# Patient Record
Sex: Female | Born: 1985 | Race: Black or African American | Hispanic: No | Marital: Single | Smoking: Current every day smoker
Health system: Southern US, Community
[De-identification: ages and names within clinical notes are randomized; demographics above are authoritative.]

## PROBLEM LIST (undated history)

## (undated) HISTORY — PX: ABSCESS DRAINAGE: SHX1119

---

## 2009-07-15 ENCOUNTER — Emergency Department (HOSPITAL_COMMUNITY): Admission: EM | Admit: 2009-07-15 | Discharge: 2009-07-15 | Payer: Self-pay | Admitting: Emergency Medicine

## 2009-12-08 ENCOUNTER — Inpatient Hospital Stay (HOSPITAL_COMMUNITY): Admission: AD | Admit: 2009-12-08 | Discharge: 2009-12-08 | Payer: Self-pay | Admitting: Obstetrics & Gynecology

## 2011-02-01 ENCOUNTER — Encounter: Payer: Self-pay | Admitting: *Deleted

## 2011-02-01 ENCOUNTER — Emergency Department (HOSPITAL_BASED_OUTPATIENT_CLINIC_OR_DEPARTMENT_OTHER)
Admission: EM | Admit: 2011-02-01 | Discharge: 2011-02-02 | Disposition: A | Discharge status: Home / Self Care | Payer: Self-pay | Attending: Emergency Medicine | Admitting: Emergency Medicine

## 2011-02-01 ENCOUNTER — Emergency Department (INDEPENDENT_AMBULATORY_CARE_PROVIDER_SITE_OTHER): Payer: Self-pay

## 2011-02-01 DIAGNOSIS — R112 Nausea with vomiting, unspecified: Secondary | ICD-10-CM | POA: Insufficient documentation

## 2011-02-01 DIAGNOSIS — A599 Trichomoniasis, unspecified: Secondary | ICD-10-CM | POA: Insufficient documentation

## 2011-02-01 DIAGNOSIS — R109 Unspecified abdominal pain: Secondary | ICD-10-CM

## 2011-02-01 DIAGNOSIS — N739 Female pelvic inflammatory disease, unspecified: Secondary | ICD-10-CM | POA: Insufficient documentation

## 2011-02-01 DIAGNOSIS — Z79899 Other long term (current) drug therapy: Secondary | ICD-10-CM | POA: Insufficient documentation

## 2011-02-01 DIAGNOSIS — N730 Acute parametritis and pelvic cellulitis: Secondary | ICD-10-CM

## 2011-02-01 DIAGNOSIS — D72829 Elevated white blood cell count, unspecified: Secondary | ICD-10-CM

## 2011-02-01 LAB — URINALYSIS, ROUTINE W REFLEX MICROSCOPIC
Glucose, UA: NEGATIVE mg/dL
pH: 6 (ref 5.0–8.0)

## 2011-02-01 LAB — WET PREP, GENITAL: Yeast Wet Prep HPF POC: NONE SEEN

## 2011-02-01 LAB — CBC
HCT: 31.4 % — ABNORMAL LOW (ref 36.0–46.0)
MCHC: 31.2 g/dL (ref 30.0–36.0)
Platelets: 223 10*3/uL (ref 150–400)
RDW: 21.3 % — ABNORMAL HIGH (ref 11.5–15.5)
WBC: 19.3 10*3/uL — ABNORMAL HIGH (ref 4.0–10.5)

## 2011-02-01 LAB — DIFFERENTIAL
Basophils Absolute: 0 10*3/uL (ref 0.0–0.1)
Basophils Relative: 0 % (ref 0–1)
Lymphocytes Relative: 8 % — ABNORMAL LOW (ref 12–46)
Monocytes Absolute: 0.8 10*3/uL (ref 0.1–1.0)
Neutro Abs: 17 10*3/uL — ABNORMAL HIGH (ref 1.7–7.7)
Neutrophils Relative %: 88 % — ABNORMAL HIGH (ref 43–77)

## 2011-02-01 LAB — URINE MICROSCOPIC-ADD ON

## 2011-02-01 LAB — PREGNANCY, URINE: Preg Test, Ur: NEGATIVE

## 2011-02-01 MED ORDER — ONDANSETRON HCL 4 MG/2ML IJ SOLN
4.0000 mg | Freq: Once | INTRAMUSCULAR | Status: AC
  Administered 2011-02-01: 4 mg via INTRAVENOUS
  Filled 2011-02-01: qty 2

## 2011-02-01 MED ORDER — HYDROMORPHONE HCL 1 MG/ML IJ SOLN
1.0000 mg | Freq: Once | INTRAMUSCULAR | Status: AC
  Administered 2011-02-01: 1 mg via INTRAVENOUS
  Filled 2011-02-01: qty 1

## 2011-02-01 MED ORDER — SODIUM CHLORIDE 0.9 % IV BOLUS (SEPSIS)
1000.0000 mL | Freq: Once | INTRAVENOUS | Status: AC
  Administered 2011-02-01: 1000 mL via INTRAVENOUS

## 2011-02-01 NOTE — ED Notes (Signed)
Pt brought in by EMS for lower abd  " cramping"  X 1 day

## 2011-02-02 LAB — COMPREHENSIVE METABOLIC PANEL
ALT: 8 U/L (ref 0–35)
AST: 22 U/L (ref 0–37)
Albumin: 4.1 g/dL (ref 3.5–5.2)
Chloride: 102 mEq/L (ref 96–112)
Creatinine, Ser: 0.9 mg/dL (ref 0.50–1.10)
Sodium: 138 mEq/L (ref 135–145)
Total Bilirubin: 0.4 mg/dL (ref 0.3–1.2)

## 2011-02-02 MED ORDER — METRONIDAZOLE 500 MG PO TABS: 500.0000 mg | ORAL_TABLET | Freq: Two times a day (BID) | ORAL | Status: AC

## 2011-02-02 MED ORDER — HYDROMORPHONE HCL 1 MG/ML IJ SOLN
1.0000 mg | Freq: Once | INTRAMUSCULAR | Status: AC
  Administered 2011-02-02: 1 mg via INTRAVENOUS
  Filled 2011-02-02: qty 1

## 2011-02-02 MED ORDER — SODIUM CHLORIDE 0.9 % IV BOLUS (SEPSIS)
1000.0000 mL | Freq: Once | INTRAVENOUS | Status: AC
  Administered 2011-02-02: 1000 mL via INTRAVENOUS

## 2011-02-02 MED ORDER — DEXTROSE 5 % IV SOLN
1.0000 g | Freq: Once | INTRAVENOUS | Status: AC
  Administered 2011-02-02: 1 g via INTRAVENOUS
  Filled 2011-02-02: qty 1

## 2011-02-02 MED ORDER — DOXYCYCLINE HYCLATE 100 MG PO CAPS: 100.0000 mg | ORAL_CAPSULE | Freq: Two times a day (BID) | ORAL | Status: AC

## 2011-02-02 MED ORDER — BELLADONNA ALK-PHENOBARBITAL 16.2 MG PO TABS: 1.0000 | ORAL_TABLET | ORAL | Status: AC | PRN

## 2011-02-02 MED ORDER — PROMETHAZINE HCL 25 MG PO TABS: 25.0000 mg | ORAL_TABLET | Freq: Four times a day (QID) | ORAL | Status: AC | PRN

## 2011-02-02 MED ORDER — IOHEXOL 300 MG/ML  SOLN
100.0000 mL | Freq: Once | INTRAMUSCULAR | Status: AC | PRN
  Administered 2011-02-02: 100 mL via INTRAVENOUS

## 2011-02-02 NOTE — ED Provider Notes (Signed)
History     CSN: [id number] Arrival date & time: 02/01/2011 10:39 PM  Chief Complaint  Patient presents with  . Abdominal Cramping    HPI  (Consider location/radiation/quality/duration/timing/severity/associated sxs/prior treatment)  Patient is a 25 y.o. female presenting with cramps. The history is provided by the patient.  Abdominal Cramping The primary symptoms of the illness include abdominal pain. The primary symptoms of the illness do not include fever, shortness of breath, diarrhea or dysuria.  Symptoms associated with the illness do not include chills, constipation or back pain. Significant associated medical issues do not include diabetes or gallstones.   patient woke up this morning with left-sided lower abdominal cramping described as sharp and severe in nature. Her symptoms have been waxing and waning throughout the day. She has associated nausea, no vomiting or diarrhea. She started her menstrual cycle today, which was normal and on time. She is sexually active and denies any new discharge or lesions. No rash. No trauma. No back pain. No dysuria, urgency or frequency. She denies any history of STD. She does have a history of anemia due to heavy cycles.  History reviewed. No pertinent past medical history.  History reviewed. No pertinent past surgical history.  History reviewed. No pertinent family history.  History  Substance Use Topics  . Smoking status: Never Smoker   . Smokeless tobacco: Not on file  . Alcohol Use: No    OB History    Grav Para Term Preterm Abortions TAB SAB Ect Mult Living                  Review of Systems  Review of Systems  Constitutional: Negative for fever and chills.  HENT: Negative for neck pain and neck stiffness.   Eyes: Negative for pain.  Respiratory: Negative for shortness of breath.   Cardiovascular: Negative for chest pain.  Gastrointestinal: Positive for abdominal pain. Negative for diarrhea and constipation.    Genitourinary: Negative for dysuria, flank pain and genital sores.  Musculoskeletal: Negative for back pain.  Skin: Negative for rash.  Neurological: Negative for headaches.  All other systems reviewed and are negative.    Allergies  Review of patient's allergies indicates no known allergies.  Home Medications   Current Outpatient Rx  Name Route Sig Dispense Refill  . FERROUS SULFATE 325 (65 FE) MG PO TBEC Oral Take 325 mg by mouth daily.      Marland Kitchen VITAMIN B-12 1000 MCG PO TABS Oral Take 1,000 mcg by mouth daily.        Physical Exam    BP 103/66  Pulse 101  Temp(Src) 98.6 F (37 C) (Oral)  Resp 18  SpO2 97%  LMP 01/30/2011  Physical Exam  Constitutional: She is oriented to person, place, and time. She appears well-developed and well-nourished.  HENT:  Head: Normocephalic and atraumatic.  Eyes: Conjunctivae and EOM are normal. Pupils are equal, round, and reactive to light.  Neck: Trachea normal. Neck supple. No thyromegaly present.  Cardiovascular: Normal rate, regular rhythm, S1 normal, S2 normal and normal pulses.     No systolic murmur is present   No diastolic murmur is present  Pulses:      Radial pulses are 2+ on the right side, and 2+ on the left side.  Pulmonary/Chest: Effort normal and breath sounds normal. She has no wheezes. She has no rhonchi. She has no rales. She exhibits no tenderness.  Abdominal: Soft. Normal appearance and bowel sounds are normal. There is no CVA tenderness and  negative Murphy's sign.       Mild diffuse abdominal tenderness, localizes discomfort left lower abdomen. Voluntary guarding. No rebound. No rash or discoloration.   Neurological: She is alert and oriented to person, place, and time. She has normal strength. No cranial nerve deficit or sensory deficit. GCS eye subscore is 4. GCS verbal subscore is 5. GCS motor subscore is 6.  Skin: Skin is warm and dry. No rash noted. She is not diaphoretic.  Psychiatric: Her speech is normal.        Cooperative and appropriate    ED Course  Pelvic exam Date/Time: 02/02/2011 2:33 AM Performed by: Sunnie Nielsen Authorized by: Sunnie Nielsen Consent: Verbal consent obtained. Risks and benefits: risks, benefits and alternatives were discussed Consent given by: patient Patient understanding: patient states understanding of the procedure being performed Patient consent: the patient's understanding of the procedure matches consent given Procedure consent: procedure consent matches procedure scheduled Patient identity confirmed: verbally with patient Time out: Immediately prior to procedure a "time out" was called to verify the correct patient, procedure, equipment, support staff and site/side marked as required. Preparation: Patient was prepped and draped in the usual sterile fashion. Comments: Normal external genitalia without rashes or lesions. Dark blood in vaginal vault consistent with history of being on her menstrual cycle. Normal cervix, no masses or localized adnexal tenderness, diffuse mild tenderness throughout   (including critical care time)  Labs Reviewed  URINALYSIS, ROUTINE W REFLEX MICROSCOPIC - Abnormal; Notable for the following:    Color, Urine RED (*) BIOCHEMICALS MAY BE AFFECTED BY COLOR   Appearance CLOUDY (*)    Hgb urine dipstick LARGE (*)    Ketones, ur 15 (*)    Leukocytes, UA SMALL (*)    All other components within normal limits  URINE MICROSCOPIC-ADD ON - Abnormal; Notable for the following:    Squamous Epithelial / LPF FEW (*)    All other components within normal limits  CBC - Abnormal; Notable for the following:    WBC 19.3 (*)    Hemoglobin 9.8 (*)    HCT 31.4 (*)    MCV 72.9 (*)    MCH 22.7 (*)    RDW 21.3 (*)    All other components within normal limits  DIFFERENTIAL - Abnormal; Notable for the following:    Neutrophils Relative 88 (*)    Neutro Abs 17.0 (*)    Lymphocytes Relative 8 (*)    All other components within normal limits   COMPREHENSIVE METABOLIC PANEL - Abnormal; Notable for the following:    Glucose, Bld 113 (*)    All other components within normal limits  WET PREP, GENITAL - Abnormal; Notable for the following:    Trich, Wet Prep FEW (*)    WBC, Wet Prep HPF POC FEW (*)    All other components within normal limits  PREGNANCY, URINE  LIPASE, BLOOD  GC/CHLAMYDIA PROBE AMP, GENITAL  RPR   Ct Abdomen Pelvis W Contrast  02/02/2011  *RADIOLOGY REPORT*  Clinical Data: Diffuse abdominal pain, nausea and vomiting. Leukocytosis.  CT ABDOMEN AND PELVIS WITH CONTRAST  Technique:  Multidetector CT imaging of the abdomen and pelvis was performed following the standard protocol during bolus administration of intravenous contrast.  Contrast: OMNIPAQUE IOHEXOL 300 MG/ML IV SOLN  Comparison: None.  Findings: The visualized lung bases are clear.  The liver and spleen are unremarkable in appearance.  The gallbladder is within normal limits.  Mildly increased attenuation within the gallbladder likely reflects beam  hardening artifact. The pancreas and adrenal glands are unremarkable.  The kidneys are within normal limits bilaterally; no hydronephrosis or perinephric stranding is seen.  No free fluid is identified.  The small bowel is unremarkable in appearance.  The stomach is within normal limits.  No acute vascular abnormalities are seen. Scattered mesenteric nodes remain at the upper limits of normal in size.  The appendix is normal in caliber, without evidence for appendicitis.  The colon is partially filled with stool and air, and is unremarkable in appearance.  The bladder is moderately distended and appears grossly unremarkable.  The uterus is within normal limits.  The ovaries are relatively symmetric, mildly prominent bilaterally; no suspicious adnexal masses are seen.  No inguinal lymphadenopathy is seen.  No acute osseous abnormalities are identified.  IMPRESSION: No acute abnormalities identified within the abdomen or  pelvis.  Original Report Authenticated By: Tonia Ghent, M.D.     Diagnoses: PID and trichomoniasis  MDM  Patient evaluated for lower left-sided abdominal pain and nausea. Negative pregnancy test no obvious UTI. Urine culture sent. Elevated white blood cell count noted and does have Trichomonas on wet prep. CT scan does not demonstrate any abscess or etiology for symptoms otherwise. Patient treated for possible PID with IV antibiotics emergency department. She has no nausea on recheck and has had no vomiting. She is afebrile and much more comfortable on recheck. Serial abdominal exams performed in her condition is significantly improved. No guarding or peritonitis on repeat exams. Patient is stable for discharge home by mouth antibiotics and close followup with GYN clinic. Tachycardia resolved.       Sunnie Nielsen, MD 02/02/11 [id number]

## 2011-02-02 NOTE — ED Notes (Signed)
Pt OOB to BR with no dificulties

## 2011-02-03 LAB — GC/CHLAMYDIA PROBE AMP, GENITAL
Chlamydia, DNA Probe: NEGATIVE
GC Probe Amp, Genital: POSITIVE — AB

## 2012-09-13 IMAGING — CT CT ABD-PELV W/ CM
2 of 4 series · 16 of 46 positions shown, 18 images · IV contrast (omnipaque)
Comparison: None.

CLINICAL DATA: Diffuse abdominal pain, nausea and vomiting.
Leukocytosis.

CT ABDOMEN AND PELVIS WITH CONTRAST
TECHNIQUE: Multidetector CT imaging of the abdomen and pelvis was
performed following the standard protocol during bolus
administration of intravenous contrast.
Contrast: 100mL OMNIPAQUE IOHEXOL 300 MG/ML IV SOLN

[Series 3: abd/pelvis 5.0 b31f · axial · 0.57mm/px · z∈[+708,+1092]mm · 13 of 85 slices shown, 15 images]
[im 4/85  soft-tissue]
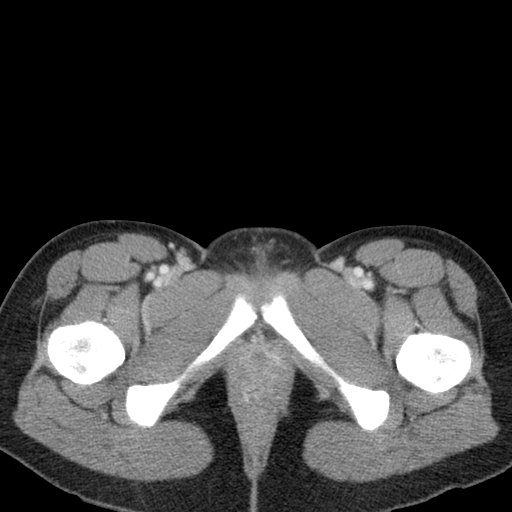
[im 4/85  bone]
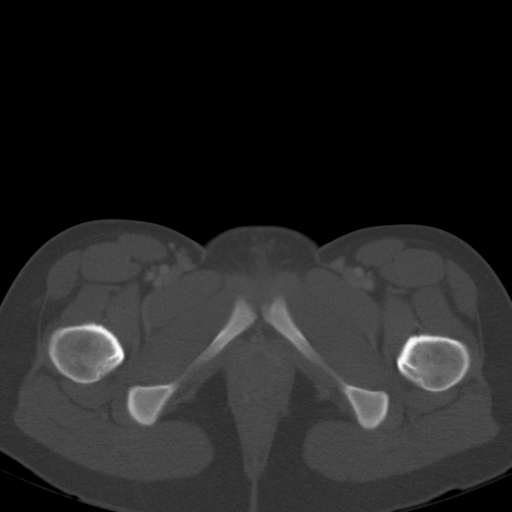
[im 11/85  soft-tissue]
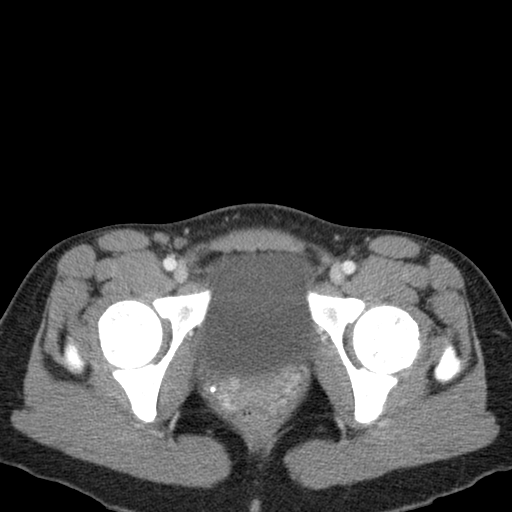
[im 17/85  soft-tissue]
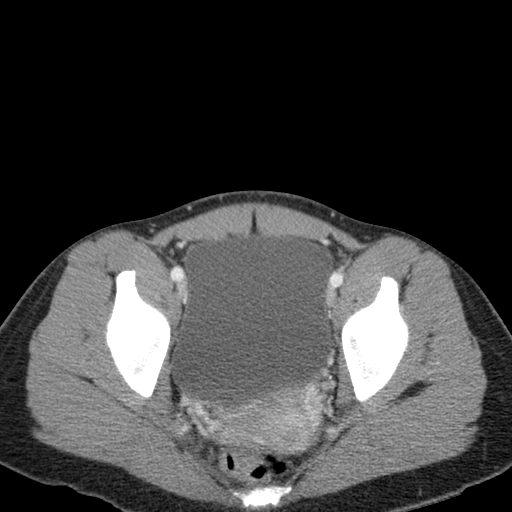
[im 24/85  soft-tissue]
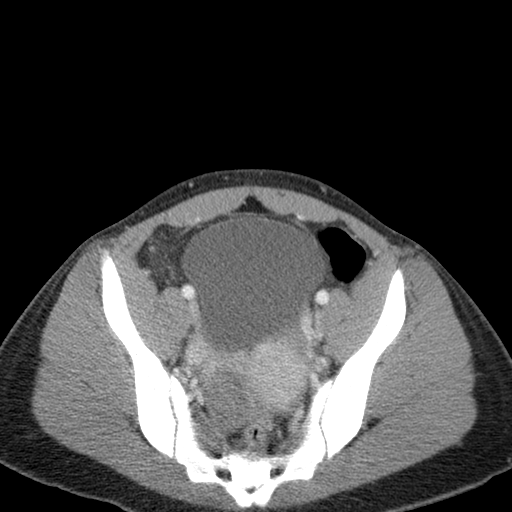
[im 31/85  soft-tissue]
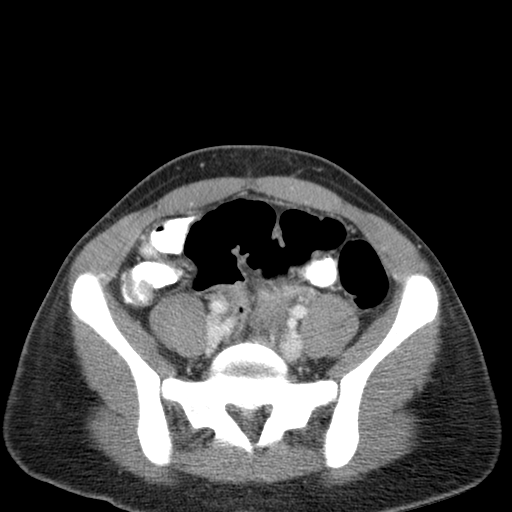
[im 37/85  soft-tissue]
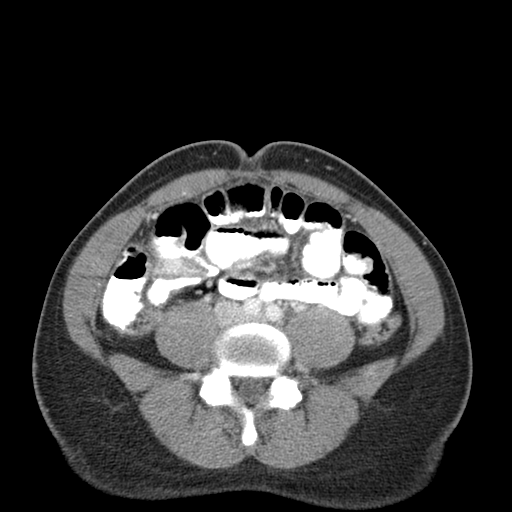
[im 44/85  soft-tissue]
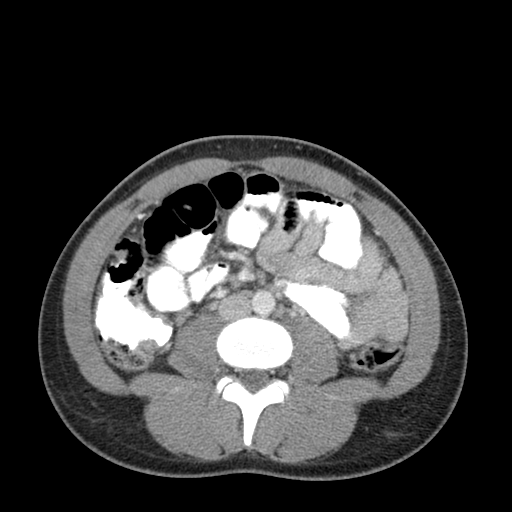
[im 48/85  soft-tissue]
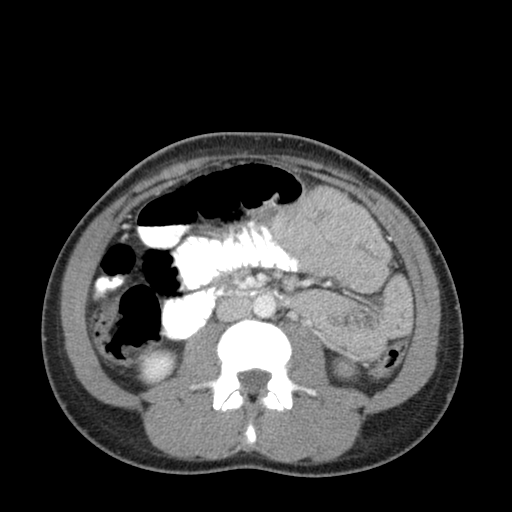
[im 54/85  soft-tissue]
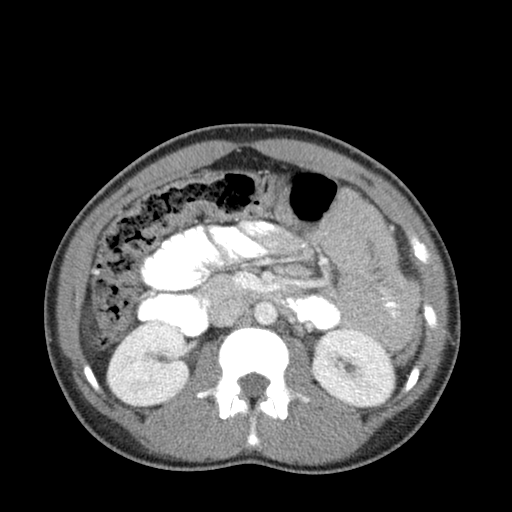
[im 54/85  bone]
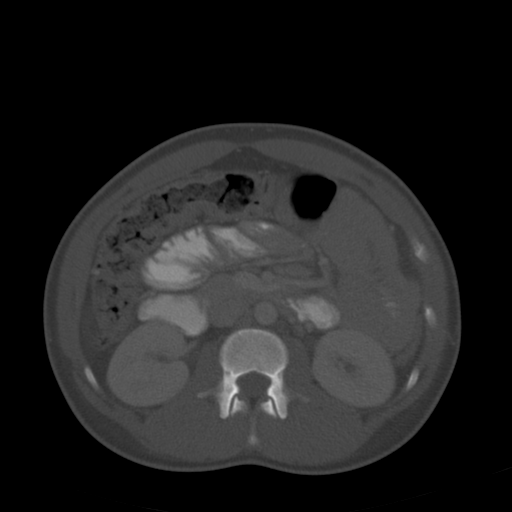
[im 61/85  soft-tissue]
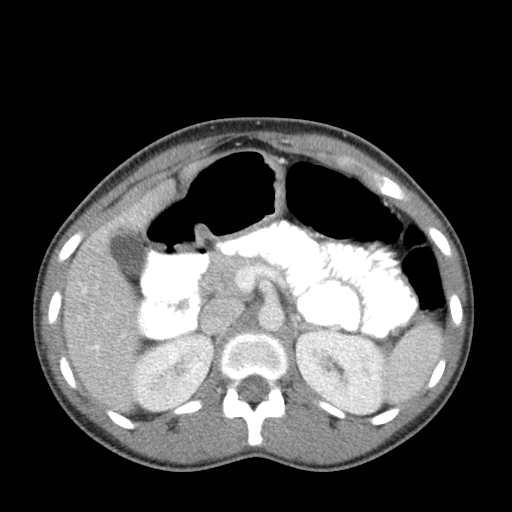
[im 68/85  soft-tissue]
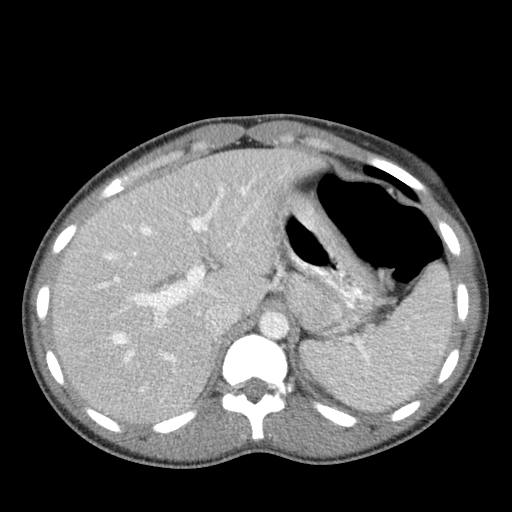
[im 74/85  soft-tissue]
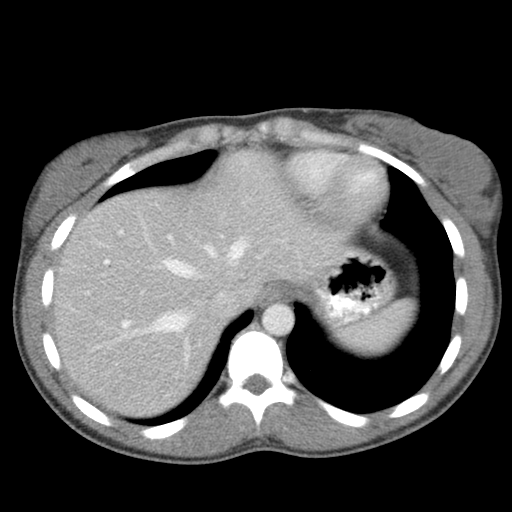
[im 81/85  soft-tissue]
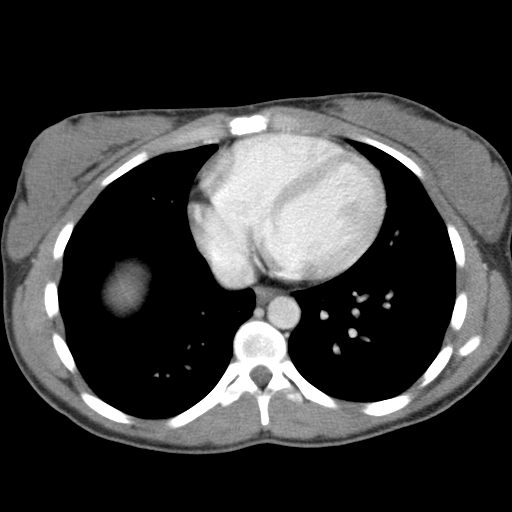

[Series 6: abd/pelvis 3.0 coronal · coronal · 0.60mm/px · 3 of 77 slices shown]
[im 26/77  soft-tissue]
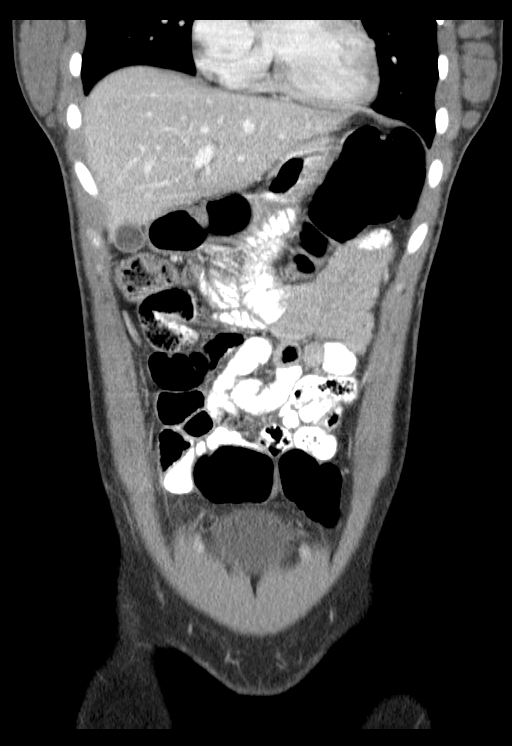
[im 34/77  soft-tissue]
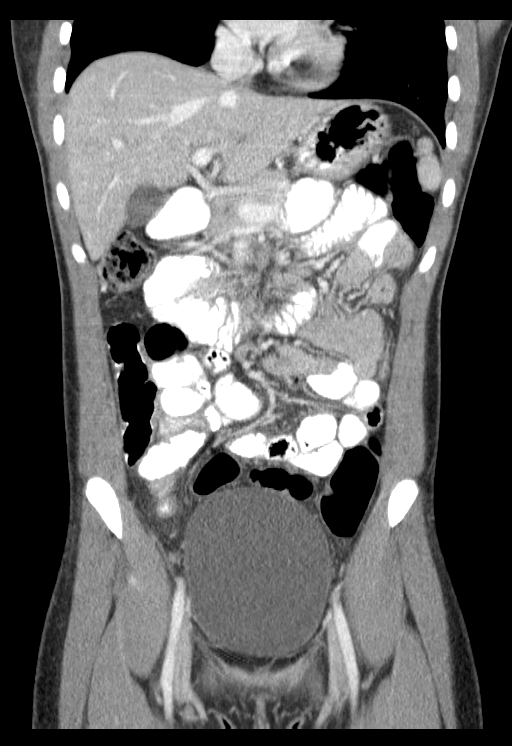
[im 43/77  soft-tissue]
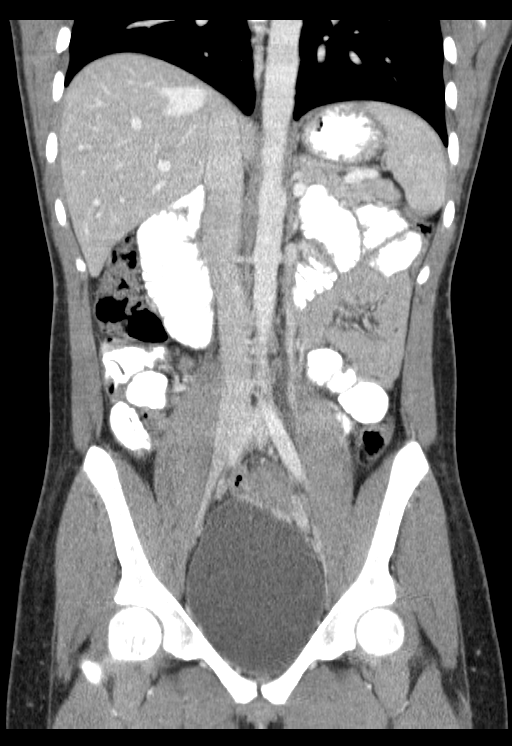

[16 of 46 positions shown; findings below may reference images not displayed]

FINDINGS: The visualized lung bases are clear.

The liver and spleen are unremarkable in appearance.  The
gallbladder is within normal limits.  Mildly increased attenuation
within the gallbladder likely reflects beam hardening artifact.
The pancreas and adrenal glands are unremarkable.  The kidneys are
within normal limits bilaterally; no hydronephrosis or perinephric
stranding is seen.

No free fluid is identified.  The small bowel is unremarkable in
appearance.  The stomach is within normal limits.  No acute
vascular abnormalities are seen. Scattered mesenteric nodes remain
at the upper limits of normal in size.

The appendix is normal in caliber, without evidence for
appendicitis.  The colon is partially filled with stool and air,
and is unremarkable in appearance.

The bladder is moderately distended and appears grossly
unremarkable.  The uterus is within normal limits.  The ovaries are
relatively symmetric, mildly prominent bilaterally; no suspicious
adnexal masses are seen.  No inguinal lymphadenopathy is seen.

No acute osseous abnormalities are identified.
IMPRESSION: No acute abnormalities identified within the abdomen or pelvis.

## 2015-02-02 ENCOUNTER — Emergency Department (HOSPITAL_COMMUNITY)
Admission: EM | Admit: 2015-02-02 | Discharge: 2015-02-02 | Disposition: A | Discharge status: Home / Self Care | Payer: Self-pay | Attending: Emergency Medicine | Admitting: Emergency Medicine

## 2015-02-02 ENCOUNTER — Encounter (HOSPITAL_COMMUNITY): Payer: Self-pay | Admitting: Nurse Practitioner

## 2015-02-02 DIAGNOSIS — K029 Dental caries, unspecified: Secondary | ICD-10-CM | POA: Insufficient documentation

## 2015-02-02 DIAGNOSIS — Z720 Tobacco use: Secondary | ICD-10-CM | POA: Insufficient documentation

## 2015-02-02 DIAGNOSIS — H9202 Otalgia, left ear: Secondary | ICD-10-CM | POA: Insufficient documentation

## 2015-02-02 DIAGNOSIS — K088 Other specified disorders of teeth and supporting structures: Secondary | ICD-10-CM | POA: Insufficient documentation

## 2015-02-02 DIAGNOSIS — K0889 Other specified disorders of teeth and supporting structures: Secondary | ICD-10-CM

## 2015-02-02 MED ORDER — HYDROCODONE-ACETAMINOPHEN 5-325 MG PO TABS
2.0000 | ORAL_TABLET | Freq: Once | ORAL | Status: AC
  Administered 2015-02-02: 2 via ORAL
  Filled 2015-02-02: qty 2

## 2015-02-02 MED ORDER — HYDROCODONE-ACETAMINOPHEN 5-325 MG PO TABS: 2.0000 | ORAL_TABLET | ORAL | Status: DC | PRN

## 2015-02-02 MED ORDER — NAPROXEN 500 MG PO TABS: 500.0000 mg | ORAL_TABLET | Freq: Two times a day (BID) | ORAL | Status: DC

## 2015-02-02 NOTE — ED Provider Notes (Signed)
CSN: [id number]     Arrival date & time 02/02/15  1301 History  This chart was scribed for Catha Gosselin, PA-C, working with Marily Memos, MD by Octavia Heir, ED Scribe. This patient was seen in room TR10C/TR10C and the patient's care was started at 1:45 PM.      Chief Complaint  Patient presents with  . Dental Pain      The history is provided by the patient. No language interpreter was used.   HPI Comments: Denise Banks is a 29 y.o. female who presents to the Emergency Department complaining of sudden onset, gradual worsening left lower dental pain onset 2 days ago. Pt states the pain radiates up into her left ear. She notes having a chipped wisdom tooth and states that could possibly be cause of her dental pain. She notes she is in the process of seeing a dentist for her pain. Pt reports taking epsom salt and 800 mg of ibuprofen to alleviate the pain with no relief.   No past medical history on file. No past surgical history on file. No family history on file. Social History  Substance Use Topics  . Smoking status: Current Every Day Smoker    Types: Cigarettes  . Smokeless tobacco: Not on file  . Alcohol Use: No   OB History    No data available     Review of Systems  Constitutional: Negative for fever.  HENT: Positive for dental problem and ear pain. Negative for facial swelling and sore throat.   Respiratory: Negative for cough and shortness of breath.       Allergies  Review of patient's allergies indicates no known allergies.  Home Medications   Prior to Admission medications   Medication Sig Start Date End Date Taking? Authorizing Joanie Duprey  HYDROcodone-acetaminophen (NORCO/VICODIN) 5-325 MG per tablet Take 2 tablets by mouth every 4 (four) hours as needed. 02/02/15   Hanna Patel-Mills, PA-C  naproxen (NAPROSYN) 500 MG tablet Take 1 tablet (500 mg total) by mouth 2 (two) times daily. 02/02/15   Catha Gosselin, PA-C   Triage vitals: BP 126/87 mmHg   Pulse 67  Temp(Src) 98.1 F (36.7 C) (Oral)  Resp 16  Ht  (1.6 m)  Wt 138 lb 11.2 oz (62.914 kg)  BMI 24.58 kg/m2  SpO2 99% Physical Exam  Constitutional: She appears well-developed and well-nourished. No distress.  HENT:  Head: Normocephalic and atraumatic.  Right Ear: External ear normal.  Left Ear: External ear normal.  She has dental carries and poor dentition throughout, She has an old fracture left lower wisdom tooth, no gum swelling or tongue swelling, no facial swelling, no drooling, no trismus, she has TTP along the gums on the left lower side but no fluctuance or drainage.  Left ear: TM is normal.  Eyes: Right eye exhibits no discharge. Left eye exhibits no discharge.  Pulmonary/Chest: Effort normal. No respiratory distress.  Neurological: She is alert. Coordination normal.  Skin: No rash noted. She is not diaphoretic.  Psychiatric: She has a normal mood and affect. Her behavior is normal.  Nursing note and vitals reviewed.   ED Course  Procedures  DIAGNOSTIC STUDIES: Oxygen Saturation is 99% on RA, normal by my interpretation.  COORDINATION OF CARE: 1:48 PM Discussed treatment plan which includes pain medication and referral to dentist with pt at bedside and pt agreed to plan.  Labs Review Labs Reviewed - No data to display  Imaging Review No results found.   EKG Interpretation None  MDM   Final diagnoses:  Pain, dental  Patient presents for left-sided dental pain. She has no signs of Ludwigs angina or deep tissue infection. I do not believe she needs anabiotic's at this time. I discussed return precautions with the patient. She was given several dental referrals. She is afebrile and well-appearing. Medications  HYDROcodone-acetaminophen (NORCO/VICODIN) 5-325 MG per tablet 2 tablet (2 tablets Oral Given 02/02/15 1404)   Filed Vitals:   02/02/15 1400  BP: 118/77  Pulse: 64  Temp: 98.1 F (36.7 C)  Resp: 20  Rx: Norco and naproxen I  personally performed the services described in this documentation, which was scribed in my presence. The recorded information has been reviewed and is accurate.   Catha Gosselin, PA-C 02/02/15 1516  Marily Memos, MD 02/03/15 [phone]

## 2015-02-02 NOTE — ED Notes (Signed)
Declined W/C at D/C and was escorted to lobby by RN. 

## 2015-02-02 NOTE — Discharge Instructions (Signed)
Dental Pain Follow up with a dentist using the resource guide below or the referrals that I have given you today. Return for any facial swelling or fever.  A tooth ache may be caused by cavities (tooth decay). Cavities expose the nerve of the tooth to air and hot or cold temperatures. It may come from an infection or abscess (also called a boil or furuncle) around your tooth. It is also often caused by dental caries (tooth decay). This causes the pain you are having. DIAGNOSIS  Your caregiver can diagnose this problem by exam. TREATMENT   If caused by an infection, it may be treated with medications which kill germs (antibiotics) and pain medications as prescribed by your caregiver. Take medications as directed.  Only take over-the-counter or prescription medicines for pain, discomfort, or fever as directed by your caregiver.  Whether the tooth ache today is caused by infection or dental disease, you should see your dentist as soon as possible for further care. SEEK MEDICAL CARE IF: The exam and treatment you received today has been provided on an emergency basis only. This is not a substitute for complete medical or dental care. If your problem worsens or new problems (symptoms) appear, and you are unable to meet with your dentist, call or return to this location. SEEK IMMEDIATE MEDICAL CARE IF:   You have a fever.  You develop redness and swelling of your face, jaw, or neck.  You are unable to open your mouth.  You have severe pain uncontrolled by pain medicine. MAKE SURE YOU:   Understand these instructions.  Will watch your condition.  Will get help right away if you are not doing well or get worse. Document Released: 04/26/2005 Document Revised: 07/19/2011 Document Reviewed: 12/13/2007 [facility] Patient Information 2015 [place], [place]. This information is not intended to replace advice given to you by your health care provider. Make sure you discuss any questions you have with  your health care provider.  Emergency Department Resource Guide 1) Find a Doctor and Pay Out of Pocket Although you won't have to find out who is covered by your insurance plan, it is a good idea to ask around and get recommendations. You will then need to call the office and see if the doctor you have chosen will accept you as a new patient and what types of options they offer for patients who are self-pay. Some doctors offer discounts or will set up payment plans for their patients who do not have insurance, but you will need to ask so you aren't surprised when you get to your appointment.  2) Contact Your Local Health Department Not all health departments have doctors that can see patients for sick visits, but many do, so it is worth a call to see if yours does. If you don't know where your local health department is, you can check in your phone book. The CDC also has a tool to help you locate your state's health department, and many state websites also have listings of all of their local health departments.  3) Find a Walk-in Clinic If your illness is not likely to be very severe or complicated, you may want to try a walk in clinic. These are popping up all over the country in pharmacies, drugstores, and shopping centers. They're usually staffed by nurse practitioners or physician assistants that have been trained to treat common illnesses and complaints. They're usually fairly quick and inexpensive. However, if you have serious medical issues or chronic medical problems,  these are probably not your best option.  No Primary Care Doctor: - Call Health Connect at  [phone] - they can help you locate a primary care doctor that  accepts your insurance, provides certain services, etc. - Physician Referral Service- [phone]  Chronic Pain Problems: Organization         Address  Phone   Notes  [facility]  [phone] Patients need to be referred by their primary care  doctor.   Medication Assistance: Organization         Address  Phone   Notes  [facility] Medication [facility] [address] [place], [place] [address] [phone] --Must be a resident of [facility] -- Must have NO insurance coverage whatsoever (no Medicaid/ Medicare, etc.) -- The pt. MUST have a primary care doctor that directs their care regularly and follows them in the community   [facility]  [phone]   [employer]  [phone]    Agencies that provide inexpensive medical care: Organization         Address  Phone   Notes  [facility]  [phone]   [facility]    [phone]   [facility] [address] [place], [place] [address] [phone]   [facility] [address] [place] [place] [phone]   Planned Parenthood    [phone]   [facility]    [phone]   [facility] and [facility]  [address], [place] Phone:  [phone], Fax:  [phone] Hours of Operation:  9 am - 6 pm, M-F.  Also accepts Medicaid/Medicare and self-pay.  [facility]  [address], [place] Phone: [phone], Fax: [phone]. Hours of Operation:  8:30 am - 5:30 pm, M-F.  Also accepts Medicaid and self-pay.  [facility] [place] [address] [place] Phone: [phone]   [facility] [address] Natasha Bence [place], [place] [phone], Ext. [phone] Mondays & Thursdays: 7-9 AM.  First 15 patients are seen on a first come, first serve basis.    Medicaid-accepting [facility] Providers:  Organization         Address  Phone   Notes  [facility] [address], [place] [phone] Also accepts self-pay patients.  [facility] [address] Laurell Josephs [place], [place]  [phone]   [facility] [address], [place] [phone]   [facility] [address], [place] [phone]   Renaye Rakers [address], [place]   [phone] Only accepts [place] Access [place] patients after they have their name applied to their card.   Self-Pay (no insurance) in [facility]:  Organization         Address  Phone   Notes  Sickle Cell Patients, [facility] Internal Medicine [address] [place], [place] [phone]   [facility] [address] [place], [place] [phone]   [facility]  [address],  [phone]   [facility]/Dr. Osei-Bonsu  [address] [place] or [address], [place] [phone] Phone number for both [place] and [place] locations is the same.  Urgent Medical and  [facility] [address] [place] [phone]   [facility] [address] [place] or [address] [phone]   [facility] [address], [place] [phone], phone; [phone], fax Sees patients 1st and 3rd Saturday of every month.  Must not qualify for public or private insurance (i.e. Medicaid, Medicare, [place] Health Choice, Veterans' Benefits)  Household income should be no more than 200% of the poverty level The clinic cannot treat you if you are pregnant or think you are pregnant  Sexually transmitted diseases are not treated at the clinic.    Dental Care: Organization         Address  Phone  Notes  [facility] Department of [facility] [address] [place], [place] [phone] Accepts children up to age 79 who are enrolled in [place] or [place] Health Choice; pregnant women with a Medicaid card; and children who have applied for Medicaid or [place] Health Choice, but were declined, whose parents can pay a reduced fee at time of  service.  [facility] Department of [facility]  [address], [place] [phone] Accepts children up to age 58 who are enrolled in [place] or [place] Health Choice; pregnant women with a Medicaid card; and children who have applied for Medicaid or [place] Health Choice, but were declined, whose parents can pay a reduced fee at time of service.  [facility]  [address] [place], [place] [phone] Patients are seen by appointment only. Walk-ins are not accepted. [facility] will see patients 15 years of age and older. Monday - Tuesday (8am-5pm) Most Wednesdays (8:30-5pm) $30 per visit, cash only  [facility] Access [facility]  [address], [facility] [phone] Patients are seen by appointment only. Walk-ins are not accepted. [facility] will see patients 48 years of age and older. One Wednesday Evening (Monthly: Volunteer Based).  $30 per visit, cash only  [employer]  [phone] for adults; Children under age 61, call Graduate Pediatric Dentistry at [phone]. Children aged 21-14, please call [phone] to request a pediatric application.  Dental services are provided in all areas of dental care including fillings, crowns and bridges, complete and partial dentures, implants, gum treatment, root canals, and extractions. Preventive care is also provided. Treatment is provided to both adults and children. Patients are selected via a lottery and there is often a waiting list.   [facility] [address] [place]  [phone] [email or web address]   [facility] [address] [place], [place] [phone], Ext. [phone] Second and Fourth Thursday of each month, opens at 6:30 AM; Clinic ends at 9 AM.  Patients are seen on a first-come first-served basis, and a limited number are seen during each clinic.   [facility]  [address] Ether Griffins [place],  [place] [phone]   Eligibility Requirements You must have lived in [place], [place], or [place] counties for at least the last three months.   You cannot be eligible for state or federal sponsored [place], including [employer], [place], or [employer].   You generally cannot be eligible for healthcare insurance through your employer.    How to apply: Eligibility screenings are held every Tuesday and Wednesday afternoon from 1:00 pm until 4:00 pm. You do not need an appointment for the interview!  [facility]  Dental Clinic [address] [place], [place] [phone]   [facility] Health Department  [phone]   [facility] Health Department  [phone]   [facility] Health Department  [phone]    Behavioral Health Resources in the Community: Intensive Outpatient Programs Organization         Address  Phone  Notes  [facility] [address], [place], [place] [phone]   [facility] [address] [place], [place] [phone]   [facility] [address] [place], [place]  [phone]   [facility] [address]  [place], [place] [phone] or [phone]   Substance Abuse Resources Organization         Address  Phone  Notes  [facility]  [phone]   [facility]  [phone]   [facility] [place]  [phone]   Floydene Flock  [phone]   Residential & Outpatient Substance Abuse Program  [phone]   Psychological Services Organization         Address  Phone  Notes  [facility] Health  [phone]   [facility]  [phone]   [facility] [address] [place] [phone] or [phone]    Mobile Crisis Teams Organization         Address  Phone  Notes  [facility], Mobile Crisis Care Unit  [phone]   [facility]  [address] [place], [place] [phone]   Doristine Locks [address] [place] [phone]    Self-Help/Support Groups Organization         Address  Phone             Notes  [facility] [place] - variety of support groups  336- I7437963 Call for more information  Narcotics Anonymous (NA), [facility] [address], [employer] [place]  2 meetings at this location   Statistician         Address  Phone  Notes  [facility] [address],    [place]  [phone]   [facility]  [address] [place] [phone], [place], [place] [phone]   [facility] [address] [place], [place] [phone] Admissions: 8am-3pm M-F  [facility] [address]    [place], [place] [phone]   [facility] [address] [place], [place], [place] [phone]   [facility] [address],  [place], [place] [phone]   [facility] - Intensive Outpatient [address] Laurell Josephs [address], [place], [place] [phone]   [facility] ([facility] [address]  [place], [place] [phone] or [phone]   [facility] ([facility]) [address], [place], [place] [phone] Accepts Medicaid  [facility] [address],  [place] [phone] Substance Abuse/Addiction Treatment   [facility] Organization         Address  Phone  Notes  [facility]  [phone]   Angie Fava, PhD [address] Ervin Knack [place], [place]   [phone] or [phone]   [facility]   [address] [place], [place] [phone]   [facility] [address] [place], [place] [phone] Insurance/Medicaid/sponsorship through [employer] and Families [address]  [place], [place] [phone]  [place], [place] [phone]    Dr. Adele Schilder  [phone]   [facility] Dept. 1) [address], [place] 2) [place] 3)  [place] [address], [place] [phone]  [phone]   [place] [phone] or [phone] (After Hours)

## 2015-02-02 NOTE — ED Notes (Signed)
She c/o L lower tooth pain increasingly worse over past few days, now radiating into L ear. She tried  ibuprofen and topical pain creamn with no relief

## 2015-06-27 ENCOUNTER — Encounter (HOSPITAL_COMMUNITY): Payer: Self-pay | Admitting: Family Medicine

## 2015-06-27 ENCOUNTER — Inpatient Hospital Stay (HOSPITAL_COMMUNITY)
Admission: EM | Admit: 2015-06-27 | Discharge: 2015-06-30 | DRG: 159 | Disposition: A | Discharge status: Home / Self Care | Payer: Self-pay | Attending: Student in an Organized Health Care Education/Training Program | Admitting: Student in an Organized Health Care Education/Training Program

## 2015-06-27 ENCOUNTER — Emergency Department (HOSPITAL_COMMUNITY): Payer: Self-pay

## 2015-06-27 DIAGNOSIS — R1310 Dysphagia, unspecified: Secondary | ICD-10-CM | POA: Diagnosis present

## 2015-06-27 DIAGNOSIS — F1290 Cannabis use, unspecified, uncomplicated: Secondary | ICD-10-CM | POA: Diagnosis present

## 2015-06-27 DIAGNOSIS — R7989 Other specified abnormal findings of blood chemistry: Secondary | ICD-10-CM | POA: Diagnosis present

## 2015-06-27 DIAGNOSIS — F17210 Nicotine dependence, cigarettes, uncomplicated: Secondary | ICD-10-CM | POA: Diagnosis present

## 2015-06-27 DIAGNOSIS — K029 Dental caries, unspecified: Secondary | ICD-10-CM | POA: Diagnosis present

## 2015-06-27 DIAGNOSIS — K047 Periapical abscess without sinus: Secondary | ICD-10-CM | POA: Diagnosis present

## 2015-06-27 DIAGNOSIS — Z23 Encounter for immunization: Secondary | ICD-10-CM

## 2015-06-27 DIAGNOSIS — K122 Cellulitis and abscess of mouth: Principal | ICD-10-CM | POA: Diagnosis present

## 2015-06-27 DIAGNOSIS — R591 Generalized enlarged lymph nodes: Secondary | ICD-10-CM | POA: Diagnosis present

## 2015-06-27 LAB — BASIC METABOLIC PANEL
Anion gap: 12 (ref 5–15)
BUN: 9 mg/dL (ref 6–20)
CHLORIDE: 103 mmol/L (ref 101–111)
CO2: 20 mmol/L — ABNORMAL LOW (ref 22–32)
CREATININE: 1.08 mg/dL — AB (ref 0.44–1.00)
Calcium: 9.4 mg/dL (ref 8.9–10.3)
GFR calc Af Amer: >60 mL/min (ref >60)
GFR calc non Af Amer: >60 mL/min (ref >60)
GLUCOSE: 97 mg/dL (ref 65–99)
Potassium: 3.8 mmol/L (ref 3.5–5.1)
SODIUM: 135 mmol/L (ref 135–145)

## 2015-06-27 LAB — CBC WITH DIFFERENTIAL/PLATELET
Basophils Absolute: 0 10*3/uL (ref 0.0–0.1)
Basophils Relative: 0 %
EOS ABS: 0 10*3/uL (ref 0.0–0.7)
EOS PCT: 0 %
HCT: 41.8 % (ref 36.0–46.0)
HEMOGLOBIN: 13.8 g/dL (ref 12.0–15.0)
LYMPHS ABS: 1.1 10*3/uL (ref 0.7–4.0)
Lymphocytes Relative: 7 %
MCH: 29.3 pg (ref 26.0–34.0)
MCHC: 33 g/dL (ref 30.0–36.0)
MCV: 88.7 fL (ref 78.0–100.0)
MONOS PCT: 6 %
Monocytes Absolute: 1 10*3/uL (ref 0.1–1.0)
Neutro Abs: 15.1 10*3/uL — ABNORMAL HIGH (ref 1.7–7.7)
Neutrophils Relative %: 87 %
PLATELETS: 173 10*3/uL (ref 150–400)
RBC: 4.71 MIL/uL (ref 3.87–5.11)
RDW: 12.5 % (ref 11.5–15.5)
WBC: 17.3 10*3/uL — ABNORMAL HIGH (ref 4.0–10.5)

## 2015-06-27 LAB — LACTIC ACID, PLASMA
Lactic Acid, Venous: 0.7 mmol/L (ref 0.5–2.0)
Lactic Acid, Venous: 0.9 mmol/L (ref 0.5–2.0)

## 2015-06-27 LAB — I-STAT BETA HCG BLOOD, ED (MC, WL, AP ONLY)

## 2015-06-27 MED ORDER — SODIUM CHLORIDE 0.9 % IV BOLUS (SEPSIS): 1000.0000 mL | Freq: Once | INTRAVENOUS | Status: DC

## 2015-06-27 MED ORDER — SODIUM CHLORIDE 0.9% FLUSH
3.0000 mL | Freq: Two times a day (BID) | INTRAVENOUS | Status: DC
  Administered 2015-06-29: 3 mL via INTRAVENOUS

## 2015-06-27 MED ORDER — SODIUM CHLORIDE 0.9 % IV SOLN
3.0000 g | Freq: Four times a day (QID) | INTRAVENOUS | Status: DC
  Administered 2015-06-27 – 2015-06-30 (×10): 3 g via INTRAVENOUS
  Filled 2015-06-27 (×14): qty 3

## 2015-06-27 MED ORDER — ACETAMINOPHEN 325 MG PO TABS
650.0000 mg | ORAL_TABLET | Freq: Four times a day (QID) | ORAL | Status: DC | PRN
  Administered 2015-06-27 – 2015-06-28 (×3): 650 mg via ORAL
  Filled 2015-06-27 (×3): qty 2

## 2015-06-27 MED ORDER — IOHEXOL 300 MG/ML  SOLN
75.0000 mL | Freq: Once | INTRAMUSCULAR | Status: AC | PRN
  Administered 2015-06-27: 75 mL via INTRAVENOUS

## 2015-06-27 MED ORDER — SODIUM CHLORIDE 0.9 % IV SOLN
INTRAVENOUS | Status: DC
  Administered 2015-06-27: 20:00:00 via INTRAVENOUS

## 2015-06-27 MED ORDER — HYDROMORPHONE HCL 1 MG/ML IJ SOLN: 0.5000 mg | Freq: Once | INTRAMUSCULAR | Status: DC

## 2015-06-27 MED ORDER — SODIUM CHLORIDE 0.9 % IV SOLN
3.0000 g | Freq: Once | INTRAVENOUS | Status: AC
  Administered 2015-06-27: 3 g via INTRAVENOUS
  Filled 2015-06-27: qty 3

## 2015-06-27 MED ORDER — ACETAMINOPHEN 650 MG RE SUPP: 650.0000 mg | Freq: Four times a day (QID) | RECTAL | Status: DC | PRN

## 2015-06-27 MED ORDER — HYDROMORPHONE HCL 1 MG/ML IJ SOLN
0.5000 mg | INTRAMUSCULAR | Status: DC | PRN
  Administered 2015-06-27 – 2015-06-28 (×4): 0.5 mg via INTRAVENOUS
  Filled 2015-06-27 (×4): qty 1

## 2015-06-27 MED ORDER — SODIUM CHLORIDE 0.9 % IV SOLN
INTRAVENOUS | Status: DC
  Administered 2015-06-27: 16:00:00 via INTRAVENOUS

## 2015-06-27 MED ORDER — HYDROMORPHONE HCL 1 MG/ML IJ SOLN
0.5000 mg | Freq: Once | INTRAMUSCULAR | Status: AC
  Administered 2015-06-27: 0.5 mg via INTRAVENOUS
  Filled 2015-06-27: qty 1

## 2015-06-27 NOTE — H&P (Signed)
Date: 06/27/2015               Denise Banks Name:  Denise Banks MRN: [id number]  DOB: 1985/08/03 Age / Sex: 30 y.o., female   PCP: No primary care provider on file.              Medical Service: Internal Medicine Teaching Service              Attending Physician: Dr. Doneen Poisson, MD    First Contact: Lacie Scotts, MS 4 Pager: [phone]  Second Contact: Dr. Heywood Iles Pager: [phone]            After Hours (After 5p/  First Contact Pager: [phone]  weekends / holidays): Second Contact Pager: [phone]   Chief Complaint:  Right neck pain with swelling  History of Present Illness: Denise Banks is a 30 year old woman with past medical history of blood transfusion at age 73 for heavy menstrual bleeding who presents to the hospital with complaints of right neck pain and swelling since yesterday. Denise Banks reports that while she was at work yesterday, she noticed a "small knot" on the right side of her neck.  Since yesterday the size has gotten bigger and it has become painful.  She reports that she is able to swallow her own secretions but finds swallowing to be painful.  She has also had difficulty opening her mouth and reports decreased PO intake since yesterday.  Her sister told her that he voice didn't not sound normal.  She tried ibuprofen for the pain, but it did not work.  She has found movements like leaning forward and mouth opening make her pain worse. 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  No recent travel.  Last dental work was in 01/2015.  Not able to remember a specific event causing pain in her mouth (food, trauma, etc).  Denise Banks endorses slight headache and mild right ear pain.  Denise Banks denies changes in vision or hearing. Denies fever and chills.  Denies chest pain, shortness of breath, abdominal pain, diarrhea, constipation and urinary symptoms.     Recent history includes visit to ED in 01/2015 for dental pain and was found to have old  fracture of left lower molar and multiple dental carries.  No antibiotics were given at that visit and Denise Banks was discharged from ED with referral to dentistry.   While in the ED, CT soft tissue neck was obtained.  Denise Banks was started on unasyn 3g IV  and given dilaudid 0.5 mg IV.    Meds: Current Facility-Administered Medications  Medication Dose Route Frequency Provider Last Rate Last Dose  . 0.9 %  sodium chloride infusion   Intravenous STAT Elpidio Anis, PA-C 125 mL/hr at 06/27/15 1556    . acetaminophen (TYLENOL) tablet 650 mg  650 mg Oral Q6H PRN Yolanda Manges, DO       Or  . acetaminophen (TYLENOL) suppository 650 mg  650 mg Rectal Q6H PRN Yolanda Manges, DO      . Ampicillin-Sulbactam (UNASYN) 3 g in sodium chloride 0.9 % 100 mL IVPB  3 g Intravenous Once Elpidio Anis, PA-C 100 mL/hr at 06/27/15 1555 3 g at 06/27/15 1555   Current Outpatient Prescriptions  Medication Sig Dispense Refill  . HYDROcodone-acetaminophen (NORCO/VICODIN) 5-325 MG per tablet Take 2 tablets by mouth every 4 (four) hours as needed. 6 tablet 0  . naproxen (NAPROSYN) 500 MG tablet Take 1 tablet (  500 mg total) by mouth 2 (two) times daily. 30 tablet 0    Allergies: Allergies as of 06/27/2015  . (No Known Allergies)   History reviewed. No pertinent past medical history. History reviewed. No pertinent past surgical history. Family History  Problem Relation Age of Onset  . Hypertension Sister   . Hypertension Maternal Grandmother   . Diabetes Mellitus II Maternal Grandmother   . Kidney disease Cousin     Social History: Denise Banks works as Lawyer in a nursing home. Smokes 2-3 cigarettes per day for the last 10 years.   Uses THC occasionally (monthly). Denies alcohol use, cocaine, heroin or prescription drug abuse.  Review of Systems: Pertinent items are noted in HPI. 10 point ROS otherwise negative  Physical Exam: Blood pressure 106/83, pulse 97, temperature 100 F (37.8 C), temperature source Oral,  resp. rate 20, last menstrual period 06/11/2015, SpO2 100 %.  General Appearance: Young woman, sitting in hospital bed, alert, cooperative, no distress, appears stated age Head: Normocephalic, without obvious abnormality, atraumatic Eyes:PERRL, conjunctiva/corneas clear, EOM's intact Ears:  Normal TM's and external ear canals, both ears Nose: Nares normal, septum midline, mucosa normal, no drainage  Mouth/Throat: Painful to open mouth and stick out tongue, poor dentition throughout, receeding gumline of lower right central incisor, old fracture of lower left wisdom tooth, no obvious trauma to right lower molars Neck:  Tenderness and swelling noted on right side of neck just below the mandible, tender submandibular LN on left, trachea midline Back: Symmetric, no curvature, ROM normal, no CVA tenderness Lungs: Clear to auscultation bilaterally, no stridor, respirations unlabored Chest Wall: No tenderness or deformity Heart: Regular rate and rhythm, S1 and S2 normal, no murmur, rub   or gallop Abdomen: Soft, non-tender, bowel sounds active all four quadrants,no masses, no organomegaly Extremities: Extremities normal, atraumatic, no cyanosis or edema Pulses: 2+ and symmetric in all extremities Lymph Nodes: Cervical, supraclavicular and axillary nodes normal Neuro: Alert and oriented, CNII-XII intact. Normal strength, normal sensation sensation   Lab results: Basic Metabolic Panel:  Recent Labs  09/60/45 1230  NA 135  K 3.8  CL 103  CO2 20*  GLUCOSE 97  BUN 9  CREATININE 1.08*  CALCIUM 9.4   CBC:  Recent Labs  06/27/15 1230  WBC 17.3*  NEUTROABS 15.1*  HGB 13.8  HCT 41.8  MCV 88.7  PLT 173   Misc. Labs: Lactic Acid: 0.7 UPreg: Negative Blood Cultures x2: Pending HIV Ab: to be collected  Imaging results:  Ct Soft Tissue Neck W Contrast  06/27/2015  CLINICAL DATA:  Right lower jaw pain and swelling yesterday which is worsening. Difficulty swallowing. Evaluation for  deep space infection. EXAM: CT NECK WITH CONTRAST TECHNIQUE: Multidetector CT imaging of the neck was performed using the standard protocol following the bolus administration of intravenous contrast. CONTRAST:  75mL OMNIPAQUE IOHEXOL 300 MG/ML  SOLN COMPARISON:  None. FINDINGS: Pharynx and larynx: Multiple dental caries are noted with most prominent involvement of the maxillary greater than mandibular molars bilaterally. Mild periapical lucencies are noted involving the left maxillary third molar and right mandibular central incisor. Moderate inflammatory change is seen in the right sublingual and submandibular spaces. Fluid tracking in the right sublingual space extends over a length of approximately 3.5 cm and measures approximately 6 mm in thickness with partial rim enhancement. No well-defined, space-occupying fluid collection/abscess is identified. There is thickening of the right platysma. Inflammatory changes extend into the right parapharyngeal space. No pharyngeal mucosal lesion is identified. The larynx  is unremarkable. Salivary glands: Submandibular and parotid glands are unremarkable. Thyroid: Unremarkable. Lymph nodes: Enlarged level I and II lymph nodes bilaterally are likely reactive and measure up to 1 cm in short axis. Vascular: Major vascular structures of the neck appear patent. Limited intracranial: The visualized portion of the brain is unremarkable. Visualized orbits: Unremarkable. Mastoids and visualized paranasal sinuses: Mild mucosal thickening in the left maxillary sinus. Hypoplastic left frontal sinus. Clear mastoid air cells. Pre dissection Skeleton: No acute osseous abnormality identified. Upper chest: Clear lung apices. IMPRESSION: 1. Moderate inflammatory changes consistent with infection in the right sublingual, submandibular, and parapharyngeal spaces. Likely developing, small right sublingual space abscess. 2. Reactive upper cervical lymphadenopathy. Electronically Signed   By:  Sebastian Ache M.D.   On: 06/27/2015 14:20    Assessment & Plan by Problem:  Denise Banks is a 30 year old woman with past medical history notable for blood transfusion at age 69 for heavy menstrual bleeding who presents with complaint of painful right sided neck swelling and odynophagia for 1 day who is found to have elevated WBC of 17.3, elevated serum Cr, and moderate inflammatory changes consistent with infection in the R sublingual, submandibular and parapharyngeal spaces and reactive cervical lymphadenopathy on CT.  #Deep Neck Space Infection:  Denise Banks was found to have elevated WBC 17.3, moderate inflammatory changes consistent with infection in the R sublingual, submandibular and parapharyngeal spaces and reactive cervical lymphadenopathy on CT. Possible developing small right sublingual abscess.  Given Denise Banks's poor dentition, the source of infection is likely dental.  Denise Banks told to notify RN if she experiences difficulty swallowing her own saliva or difficulty breathing.  - ENT consulted re surgical management vs antibiotics  - NPO while waiting for ENT recs - Started ampicillin-sulbactam (unasyn) 3 g IV Q6H  - Dilaudid 0.5 mg IV prn pain as Denise Banks has elevated Cr- will reassess tomorrow as antiinflammatories would be better for her pain - Follow WBC on am CBC  #Elevated Serum Creatinine: Denise Banks was found to have serum Cr of 1.08 on BMP today.  No previous baseline with which to compare. Likely a results of poor po intake since yesterday.  Will continue to monitor. - Monitor Cr on am BMP - IVF Normal Saline @ 125 ml/hr for 12 hr  FENGI/DVT Ppx:   Fluids: NS @ 125 ml/hr for 12 hrs  Electrolytes: No need to replete at this time  Nutrition: NPO pending ENT evaluation  GI: None  DVT ppx: SCDs as Denise Banks is ambulatory and pending ENT recs re surgical management    Dispo: Admit to floor.    This is a Psychologist, occupational Note.  The care of the Denise Banks was discussed with Dr. Andrey Campanile and  the assessment and plan was formulated with their assistance.  Please see their note for official documentation of the Denise Banks encounter.   Signed: Lacie Scotts, Med Student 06/27/2015, 4:24 PM

## 2015-06-27 NOTE — ED Notes (Signed)
Pt here for swelling to right neck area with trouble swallowing. sts worsening over 2 days.

## 2015-06-27 NOTE — Consult Note (Signed)
ENT CONSULT:  Reason for Consult: Right perimandibular infection Referring Physician: Medical teaching service  Denise Banks is an 30 y.o. female.  HPI: The patient is an otherwise healthy 30 year old female who presents with a one-day history of pain in the right submandibular region and progressive soft tissue swelling. She has a history of previous dental infections and has been treated in the emergency department, last in September 2016. No recent history of trauma, respiratory infection or other concerns. She reports progressive symptoms of swelling in the submandibular space, odynophagia and fever.  History reviewed. No pertinent past medical history.  History reviewed. No pertinent past surgical history.  Family History  Problem Relation Age of Onset  . Hypertension Sister   . Hypertension Maternal Grandmother   . Diabetes Mellitus II Maternal Grandmother   . Kidney disease Cousin     Social History:  reports that she has been smoking Cigarettes.  She has been smoking about 0.00 packs per day. She does not have any smokeless tobacco history on file. She reports that she does not drink alcohol or use illicit drugs.  Allergies: No Known Allergies  Medications: I have reviewed the patient's current medications.  Results for orders placed or performed during the hospital encounter of 06/27/15 (from the past 48 hour(s))  CBC with Differential     Status: Abnormal   Collection Time: 06/27/15 12:30 PM  Result Value Ref Range   WBC 17.3 (H) 4.0 - 10.5 K/uL   RBC 4.71 3.87 - 5.11 MIL/uL   Hemoglobin 13.8 12.0 - 15.0 g/dL   HCT 41.8 36.0 - 46.0 %   MCV 88.7 78.0 - 100.0 fL   MCH 29.3 26.0 - 34.0 pg   MCHC 33.0 30.0 - 36.0 g/dL   RDW 12.5 11.5 - 15.5 %   Platelets 173 150 - 400 K/uL   Neutrophils Relative % 87 %   Neutro Abs 15.1 (H) 1.7 - 7.7 K/uL   Lymphocytes Relative 7 %   Lymphs Abs 1.1 0.7 - 4.0 K/uL   Monocytes Relative 6 %   Monocytes Absolute 1.0 0.1 - 1.0 K/uL    Eosinophils Relative 0 %   Eosinophils Absolute 0.0 0.0 - 0.7 K/uL   Basophils Relative 0 %   Basophils Absolute 0.0 0.0 - 0.1 K/uL  Basic metabolic panel     Status: Abnormal   Collection Time: 06/27/15 12:30 PM  Result Value Ref Range   Sodium 135 135 - 145 mmol/L   Potassium 3.8 3.5 - 5.1 mmol/L   Chloride 103 101 - 111 mmol/L   CO2 20 (L) 22 - 32 mmol/L   Glucose, Bld 97 65 - 99 mg/dL   BUN 9 6 - 20 mg/dL   Creatinine, Ser 1.08 (H) 0.44 - 1.00 mg/dL   Calcium 9.4 8.9 - 10.3 mg/dL   GFR calc non Af Amer >60 >60 mL/min   GFR calc Af Amer >60 >60 mL/min    Comment: (NOTE) The eGFR has been calculated using the CKD EPI equation. This calculation has not been validated in all clinical situations. eGFR's persistently <60 mL/min signify possible Chronic Kidney Disease.    Anion gap 12 5 - 15  I-Stat beta hCG blood, ED     Status: None   Collection Time: 06/27/15 12:54 PM  Result Value Ref Range   I-stat hCG, quantitative <5.0 <5 mIU/mL   Comment 3            Comment:   GEST. AGE  CONC.  (mIU/mL)   <=1 WEEK        5 - 50     2 WEEKS       50 - 500     3 WEEKS       100 - 10,000     4 WEEKS     1,000 - 30,000        FEMALE AND NON-PREGNANT FEMALE:     LESS THAN 5 mIU/mL   Lactic acid, plasma     Status: None   Collection Time: 06/27/15  3:08 PM  Result Value Ref Range   Lactic Acid, Venous 0.7 0.5 - 2.0 mmol/L  Lactic acid, plasma     Status: None   Collection Time: 06/27/15  5:13 PM  Result Value Ref Range   Lactic Acid, Venous 0.9 0.5 - 2.0 mmol/L    Ct Soft Tissue Neck W Contrast  06/27/2015  CLINICAL DATA:  Right lower jaw pain and swelling yesterday which is worsening. Difficulty swallowing. Evaluation for deep space infection. EXAM: CT NECK WITH CONTRAST TECHNIQUE: Multidetector CT imaging of the neck was performed using the standard protocol following the bolus administration of intravenous contrast. CONTRAST:  74m OMNIPAQUE IOHEXOL 300 MG/ML  SOLN COMPARISON:   None. FINDINGS: Pharynx and larynx: Multiple dental caries are noted with most prominent involvement of the maxillary greater than mandibular molars bilaterally. Mild periapical lucencies are noted involving the left maxillary third molar and right mandibular central incisor. Moderate inflammatory change is seen in the right sublingual and submandibular spaces. Fluid tracking in the right sublingual space extends over a length of approximately 3.5 cm and measures approximately 6 mm in thickness with partial rim enhancement. No well-defined, space-occupying fluid collection/abscess is identified. There is thickening of the right platysma. Inflammatory changes extend into the right parapharyngeal space. No pharyngeal mucosal lesion is identified. The larynx is unremarkable. Salivary glands: Submandibular and parotid glands are unremarkable. Thyroid: Unremarkable. Lymph nodes: Enlarged level I and II lymph nodes bilaterally are likely reactive and measure up to 1 cm in short axis. Vascular: Major vascular structures of the neck appear patent. Limited intracranial: The visualized portion of the brain is unremarkable. Visualized orbits: Unremarkable. Mastoids and visualized paranasal sinuses: Mild mucosal thickening in the left maxillary sinus. Hypoplastic left frontal sinus. Clear mastoid air cells. Pre dissection Skeleton: No acute osseous abnormality identified. Upper chest: Clear lung apices. IMPRESSION: 1. Moderate inflammatory changes consistent with infection in the right sublingual, submandibular, and parapharyngeal spaces. Likely developing, small right sublingual space abscess. 2. Reactive upper cervical lymphadenopathy. Electronically Signed   By: ALogan BoresM.D.   On: 06/27/2015 14:20    ROS:ROS 12 systems reviewed and negative except as stated in HPI   Blood pressure 106/83, pulse 97, temperature 100 F (37.8 C), temperature source Oral, resp. rate 20, last menstrual period 06/11/2015, SpO2 100  %.  PHYSICAL EXAM: General appearance - alert, well appearing, and in no distress and moderate pain and odynophagia. Mouth - mucous membranes moist, pharynx normal without lesions and Normal floor of mouth, no swelling, tender to palpation, normal tongue mobility. Neck - erythema and swelling in the right submandibular skin, tender to palpation.    Studies Reviewed: CT scan of the neck  Assessment/Plan: The patient presents with progressive symptoms of right submandibular swelling and pain, odynophagia and fever. Findings on examination and CT scan are consistent with early infection in the right submandibular space with inflammatory changes, no evidence of abscess. Findings are consistent with  dental infection. No surgical abscess requiring drainage at this time. Continue to closely monitor patient's symptoms and consider rescan if progressing over the next 48 hours. The patient will require a dental evaluation and further treatment. Recommend continued broad-spectrum antibiotics and pain management with liquids and soft oral diet as tolerated. Expect improvement over the next 48-72 hours. Please reconsult as needed if surgical management of neck abscess required.  [place], Neya Creegan 06/27/2015, 6:08 PM

## 2015-06-27 NOTE — ED Provider Notes (Signed)
CSN: [id number]     Arrival date & time 06/27/15  1191 History   First MD Initiated Contact with Patient 06/27/15 1208     Chief Complaint  Patient presents with  . Facial Swelling     (Consider location/radiation/quality/duration/timing/severity/associated sxs/prior Treatment) HPI Comments: Patient with no significant medical history presents with painful swelling of her right neck since yesterday causing difficulty swallowing. She denies fever. No cough, breathing difficulty. No nausea, vomiting.   The history is provided by the patient. No language interpreter was used.    History reviewed. No pertinent past medical history. History reviewed. No pertinent past surgical history. History reviewed. No pertinent family history. Social History  Substance Use Topics  . Smoking status: Current Every Day Smoker    Types: Cigarettes  . Smokeless tobacco: None  . Alcohol Use: No   OB History    No data available     Review of Systems  Constitutional: Negative for fever.  HENT: Positive for facial swelling and trouble swallowing.   Respiratory: Negative for shortness of breath.   Cardiovascular: Negative for chest pain.  Gastrointestinal: Negative for nausea.  Neurological: Negative for headaches.      Allergies  Review of patient's allergies indicates no known allergies.  Home Medications   Prior to Admission medications   Medication Sig Start Date End Date Taking? Authorizing Provider  HYDROcodone-acetaminophen (NORCO/VICODIN) 5-325 MG per tablet Take 2 tablets by mouth every 4 (four) hours as needed. 02/02/15   Hanna Patel-Mills, PA-C  naproxen (NAPROSYN) 500 MG tablet Take 1 tablet (500 mg total) by mouth 2 (two) times daily. 02/02/15   Hanna Patel-Mills, PA-C   BP 124/83 mmHg  Pulse 70  Temp(Src) 100 F (37.8 C) (Oral)  Resp 22  SpO2 100%  LMP 06/11/2015 Physical Exam  Constitutional: She appears well-developed and well-nourished. No distress.  HENT:  Head:  Normocephalic.  Mouth/Throat: Oropharynx is clear and moist.  Marked right submandibular swelling that is significantly tender to touch. There is no lateral or posterior neck tenderness.   Eyes: Conjunctivae are normal.  Neck: Normal range of motion. No tracheal deviation present.  Pulmonary/Chest: Effort normal.  Abdominal: Soft.  Musculoskeletal: Normal range of motion.    ED Course  Procedures (including critical care time) Labs Review Labs Reviewed  CBC WITH DIFFERENTIAL/PLATELET  BASIC METABOLIC PANEL  I-STAT BETA HCG BLOOD, ED ([facility], [facility], [facility] ONLY)   Results for orders placed or performed during the hospital encounter of 06/27/15  CBC with Differential  Result Value Ref Range   WBC 17.3 (H) 4.0 - 10.5 K/uL   RBC 4.71 3.87 - 5.11 MIL/uL   Hemoglobin 13.8 12.0 - 15.0 g/dL   HCT 47.8 29.5 - 62.1 %   MCV 88.7 78.0 - 100.0 fL   MCH 29.3 26.0 - 34.0 pg   MCHC 33.0 30.0 - 36.0 g/dL   RDW 30.8 65.7 - 84.6 %   Platelets 173 150 - 400 K/uL   Neutrophils Relative % 87 %   Neutro Abs 15.1 (H) 1.7 - 7.7 K/uL   Lymphocytes Relative 7 %   Lymphs Abs 1.1 0.7 - 4.0 K/uL   Monocytes Relative 6 %   Monocytes Absolute 1.0 0.1 - 1.0 K/uL   Eosinophils Relative 0 %   Eosinophils Absolute 0.0 0.0 - 0.7 K/uL   Basophils Relative 0 %   Basophils Absolute 0.0 0.0 - 0.1 K/uL  Basic metabolic panel  Result Value Ref Range   Sodium 135 135 - 145  mmol/L   Potassium 3.8 3.5 - 5.1 mmol/L   Chloride 103 101 - 111 mmol/L   CO2 20 (L) 22 - 32 mmol/L   Glucose, Bld 97 65 - 99 mg/dL   BUN 9 6 - 20 mg/dL   Creatinine, Ser 9.60 (H) 0.44 - 1.00 mg/dL   Calcium 9.4 8.9 - 45.4 mg/dL   GFR calc non Af Amer >60 >60 mL/min   GFR calc Af Amer >60 >60 mL/min   Anion gap 12 5 - 15  I-Stat beta hCG blood, ED  Result Value Ref Range   I-stat hCG, quantitative <5.0 <5 mIU/mL   Comment 3           Ct Soft Tissue Neck W Contrast  06/27/2015  CLINICAL DATA:  Right lower jaw pain and swelling yesterday which  is worsening. Difficulty swallowing. Evaluation for deep space infection. EXAM: CT NECK WITH CONTRAST TECHNIQUE: Multidetector CT imaging of the neck was performed using the standard protocol following the bolus administration of intravenous contrast. CONTRAST:  75mL OMNIPAQUE IOHEXOL 300 MG/ML  SOLN COMPARISON:  None. FINDINGS: Pharynx and larynx: Multiple dental caries are noted with most prominent involvement of the maxillary greater than mandibular molars bilaterally. Mild periapical lucencies are noted involving the left maxillary third molar and right mandibular central incisor. Moderate inflammatory change is seen in the right sublingual and submandibular spaces. Fluid tracking in the right sublingual space extends over a length of approximately 3.5 cm and measures approximately 6 mm in thickness with partial rim enhancement. No well-defined, space-occupying fluid collection/abscess is identified. There is thickening of the right platysma. Inflammatory changes extend into the right parapharyngeal space. No pharyngeal mucosal lesion is identified. The larynx is unremarkable. Salivary glands: Submandibular and parotid glands are unremarkable. Thyroid: Unremarkable. Lymph nodes: Enlarged level I and II lymph nodes bilaterally are likely reactive and measure up to 1 cm in short axis. Vascular: Major vascular structures of the neck appear patent. Limited intracranial: The visualized portion of the brain is unremarkable. Visualized orbits: Unremarkable. Mastoids and visualized paranasal sinuses: Mild mucosal thickening in the left maxillary sinus. Hypoplastic left frontal sinus. Clear mastoid air cells. Pre dissection Skeleton: No acute osseous abnormality identified. Upper chest: Clear lung apices. IMPRESSION: 1. Moderate inflammatory changes consistent with infection in the right sublingual, submandibular, and parapharyngeal spaces. Likely developing, small right sublingual space abscess. 2. Reactive upper  cervical lymphadenopathy. Electronically Signed   By: Sebastian Ache M.D.   On: 06/27/2015 14:20    Imaging Review No results found. I have personally reviewed and evaluated these images and lab results as part of my medical decision-making.   EKG Interpretation None      MDM   Final diagnoses:  None    1. Submandibular space infection  Patient with painful swelling of right submandible and reports of difficulty swallowing. No known fever. Here the patient is found to have marked right submandibular swelling that is tender, leukocytosis and CT evidence soft tissue infection wtihout abscess. She is handling her own secretions. VSS, no evidence of sepsis.   IV abx started, Blood cultures ordered. Admission called and Internal Medicine accepts the patient onto their service.     Elpidio Anis, PA-C 06/27/15 1501  Lyndal Pulley, MD 06/29/15 [phone]

## 2015-06-27 NOTE — H&P (Signed)
Date: 06/27/2015               Patient Name:  Denise Banks MRN: [id number]  DOB: 1985/07/27 Age / Sex: 30 y.o., female   PCP: No primary care provider on file.         Medical Service: Internal Medicine Teaching Service         Attending Physician: Dr. Doneen Poisson, MD    First Contact: Lacie Scotts, MS4 Pager: [phone]  Second Contact: Dr. Heywood Iles Pager: [phone]       After Hours (After 5p/  First Contact Pager: [phone]  weekends / holidays): Second Contact Pager: [phone]   Chief Complaint: right neck swelling and painful swallowing  History of Present Illness: Denise Banks is a 30 year old previously healthy woman here with c/o right neck swelling and odynophagia x 2 days.  She noticed that the right side of her neck was painful and swollen yesterday.  Last night she had some dyspnea and painful swallowing.  This AM when she woke up the pain had increased so she came to the ED. It hurts to swallow but she is able to swallow and manage secretions.  Also, has right ear pain but no discharge or hearing loss.  She denies sore throat, rhinorrhea, cough, headache, vision change, chest pain.  She took ibuprofen which did not help.  She was seen in ED five months ago for dental pain on the right side.  She subsequently was evaluated by a dentist and was told she needs all four wisdom teeth pulled.  She is waiting on dental insurance before she can get this done.  She is a current daily smoker, 2-3 cigs daily since age 19.  Occasional THC use.  No EtOH use.  Meds: Current Facility-Administered Medications  Medication Dose Route Frequency Provider Last Rate Last Dose  . Ampicillin-Sulbactam (UNASYN) 3 g in sodium chloride 0.9 % 100 mL IVPB  3 g Intravenous Once Elpidio Anis, PA-C       Current Outpatient Prescriptions  Medication Sig Dispense Refill  . HYDROcodone-acetaminophen (NORCO/VICODIN) 5-325 MG per tablet Take 2 tablets by mouth every 4 (four) hours as needed. 6 tablet 0    . naproxen (NAPROSYN) 500 MG tablet Take 1 tablet (500 mg total) by mouth 2 (two) times daily. 30 tablet 0    Allergies: Allergies as of 06/27/2015  . (No Known Allergies)   History reviewed. No pertinent past medical history. History reviewed. No pertinent past surgical history. Family History  Problem Relation Age of Onset  . Hypertension Sister   . Hypertension Maternal Grandmother   . Diabetes Mellitus II Maternal Grandmother   . Kidney disease Cousin    Social History   Social History  . Marital Status: Single    Spouse Name: N/A  . Number of Children: N/A  . Years of Education: N/A   Occupational History  . CNA    Social History Main Topics  . Smoking status: Current Every Day Smoker -- 0.00 packs/day    Types: Cigarettes  . Smokeless tobacco: Not on file  . Alcohol Use: No  . Drug Use: No  . Sexual Activity: Not on file   Other Topics Concern  . Not on file   Social History Narrative   Review of Systems: General:  Denies anorexia, unexplained weight loss, fever or chills HEENT:  Per HPI Heart:  Denies chest pain, palpitations Lung:  Dyspnea last night but none today, denies cough GI:  Denies abdominal  pain, N/V GU:  Denies dysuria, hematuria, frequency or polyuria Neuro:  Denies focal weakness, change in vision.  Occasional numbness/tingling B/L legs.  Physical Exam: Blood pressure 112/74, pulse 86, temperature 100 F (37.8 C), temperature source Oral, resp. rate 22, last menstrual period 06/11/2015, SpO2 99 %. General: resting in bed in NAD HEENT: PERRL, EOMI, no scleral icterus, she has pain with jaw opening but she is able to open and extend tongue, left mandibular wisdom tooth appears chipped, significant gum recession of right central incisor, otherwise no obvious dental abscess; there is B/L submandibular lymphadenopathy (R>L); TMs intact B/L; no mastoid/tragus/pinna tenderness Cardiac: RRR, no rubs, murmurs or gallops Pulm: clear to auscultation  bilaterally, moving normal volumes of air, no accessory muscle use, no stridor Abd: soft, nontender, nondistended, BS present Ext: warm and well perfused, no pedal edema Neuro: alert and oriented X3, cranial nerves II-XII grossly intact, moving extremities spontaneously, responding appropriately  Lab results: Basic Metabolic Panel:  Recent Labs  16/10/96 1230  NA 135  K 3.8  CL 103  CO2 20*  GLUCOSE 97  BUN 9  CREATININE 1.08*  CALCIUM 9.4   CBC:  Recent Labs  06/27/15 1230  WBC 17.3*  NEUTROABS 15.1*  HGB 13.8  HCT 41.8  MCV 88.7  PLT 173   Imaging results:  Ct Soft Tissue Neck W Contrast  06/27/2015  CLINICAL DATA:  Right lower jaw pain and swelling yesterday which is worsening. Difficulty swallowing. Evaluation for deep space infection. EXAM: CT NECK WITH CONTRAST TECHNIQUE: Multidetector CT imaging of the neck was performed using the standard protocol following the bolus administration of intravenous contrast. CONTRAST:  75mL OMNIPAQUE IOHEXOL 300 MG/ML  SOLN COMPARISON:  None. FINDINGS: Pharynx and larynx: Multiple dental caries are noted with most prominent involvement of the maxillary greater than mandibular molars bilaterally. Mild periapical lucencies are noted involving the left maxillary third molar and right mandibular central incisor. Moderate inflammatory change is seen in the right sublingual and submandibular spaces. Fluid tracking in the right sublingual space extends over a length of approximately 3.5 cm and measures approximately 6 mm in thickness with partial rim enhancement. No well-defined, space-occupying fluid collection/abscess is identified. There is thickening of the right platysma. Inflammatory changes extend into the right parapharyngeal space. No pharyngeal mucosal lesion is identified. The larynx is unremarkable. Salivary glands: Submandibular and parotid glands are unremarkable. Thyroid: Unremarkable. Lymph nodes: Enlarged level I and II lymph nodes  bilaterally are likely reactive and measure up to 1 cm in short axis. Vascular: Major vascular structures of the neck appear patent. Limited intracranial: The visualized portion of the brain is unremarkable. Visualized orbits: Unremarkable. Mastoids and visualized paranasal sinuses: Mild mucosal thickening in the left maxillary sinus. Hypoplastic left frontal sinus. Clear mastoid air cells. Pre dissection Skeleton: No acute osseous abnormality identified. Upper chest: Clear lung apices. IMPRESSION: 1. Moderate inflammatory changes consistent with infection in the right sublingual, submandibular, and parapharyngeal spaces. Likely developing, small right sublingual space abscess. 2. Reactive upper cervical lymphadenopathy. Electronically Signed   By: Sebastian Ache M.D.   On: 06/27/2015 14:20    Assessment & Plan by Problem: 30 year old previously healthy woman here with right neck swelling and odynophagia since yesterday.  Deep neck space infection:  Infection in submandibular, and parapharyngeal spaces with likely developing abscess in sublingual space.  Infection has likely spread from dental caries noted on imaging.  She has pain with swallowing but she is able to swallow fluids (she is drinking  water during exam) and manage secretions.  There is no stridor, pooling of secretions or signs of airway compromise.   - treat infection with IV Unasyn - prn dilaudid for pain control; plan to switch to NSAID for anti-inflam and pain control once Cr improves - Tylenol prn for pain/headache/fever - Yankauer suction prn - close monitoring for respiratory compromise; she has been instructed to notify RN immediately if she develops difficulty managing saliva or difficulty breathing - ENT has been consulted and recommendations on further management are appreciated (I&D of sublingual space vs continued antibiotics alone).  Elevated creatinine:  Cr 1.08 with unknown baseline.  Likely pre-renal due to decrease po in  past day due to odynophagia. - IV fluids - BMP in AM  Diet:  NPO with ice chips in case of surgery; if no I&D needed, start full liquids and ADAT VTE ppx:  Low VTE risk in this healthy, ambulatory patient.  Will provide SCDs and ambulate qshift with assistance. Code status:  Full code  Dispo: Disposition is deferred at this time, awaiting improvement of current medical problems. Anticipated discharge in approximately 1-2 day(s).   The patient does not have a current PCP (No primary care provider on file.) and does need an [place] [facility] hospital follow-up appointment after discharge.  The patient does not have transportation limitations that hinder transportation to clinic appointments.  Signed: Yolanda Manges, DO 06/27/2015, 2:50 PM

## 2015-06-27 NOTE — Consult Note (Signed)
Pharmacy Antibiotic Note  Denise Banks is a 30 y.o. female admitted on 06/27/2015 with submandibular infection.  Pharmacy has been consulted for Unasyn dosing.  Pt with painful swelling and difficulty swallowing. WBC elevated. CT shows evidence of soft tissue infectious without abscess.  Plan: Unasyn 3 g IV q8h  Monitor renal function, cultures, LOT     Temp (24hrs), Avg:100 F (37.8 C), Min:100 F (37.8 C), Max:100 F (37.8 C)   Recent Labs Lab 06/27/15 1230 06/27/15 1508  WBC 17.3*  --   CREATININE 1.08*  --   LATICACIDVEN  --  0.7    CrCl cannot be calculated (Unknown ideal weight.).    No Known Allergies  Antimicrobials this admission: Unasyn 2/17 >>   Dose adjustments this admission: n/a  Microbiology results: 2/17 BCx:   Thank you for allowing pharmacy to be a part of this patient's care.  Greggory Stallion, PharmD Clinical Pharmacy Resident Pager # [phone] 06/27/2015 4:34 PM

## 2015-06-28 ENCOUNTER — Observation Stay (HOSPITAL_COMMUNITY): Payer: Self-pay

## 2015-06-28 ENCOUNTER — Encounter (HOSPITAL_COMMUNITY): Payer: Self-pay | Admitting: Radiology

## 2015-06-28 ENCOUNTER — Observation Stay (HOSPITAL_COMMUNITY): Payer: MEDICAID | Admitting: Certified Registered Nurse Anesthetist

## 2015-06-28 ENCOUNTER — Encounter (HOSPITAL_COMMUNITY)
Admission: EM | Disposition: A | Discharge status: Home / Self Care | Payer: Self-pay | Source: Home / Self Care | Attending: Internal Medicine

## 2015-06-28 DIAGNOSIS — K029 Dental caries, unspecified: Secondary | ICD-10-CM

## 2015-06-28 DIAGNOSIS — K122 Cellulitis and abscess of mouth: Secondary | ICD-10-CM | POA: Diagnosis present

## 2015-06-28 HISTORY — PX: INCISION AND DRAINAGE ABSCESS: SHX5864

## 2015-06-28 LAB — CBC WITH DIFFERENTIAL/PLATELET
BASOS PCT: 0 %
Basophils Absolute: 0 10*3/uL (ref 0.0–0.1)
Eosinophils Absolute: 0.1 10*3/uL (ref 0.0–0.7)
Eosinophils Relative: 0 %
HEMATOCRIT: 38.7 % (ref 36.0–46.0)
HEMOGLOBIN: 12.5 g/dL (ref 12.0–15.0)
LYMPHS ABS: 1.9 10*3/uL (ref 0.7–4.0)
Lymphocytes Relative: 13 %
MCH: 28.8 pg (ref 26.0–34.0)
MCHC: 32.3 g/dL (ref 30.0–36.0)
MCV: 89.2 fL (ref 78.0–100.0)
MONOS PCT: 9 %
Monocytes Absolute: 1.3 10*3/uL — ABNORMAL HIGH (ref 0.1–1.0)
NEUTROS ABS: 11.8 10*3/uL — AB (ref 1.7–7.7)
NEUTROS PCT: 78 %
Platelets: 176 10*3/uL (ref 150–400)
RBC: 4.34 MIL/uL (ref 3.87–5.11)
RDW: 12.7 % (ref 11.5–15.5)
WBC: 15.1 10*3/uL — ABNORMAL HIGH (ref 4.0–10.5)

## 2015-06-28 LAB — BASIC METABOLIC PANEL
Anion gap: 12 (ref 5–15)
BUN: 6 mg/dL (ref 6–20)
CHLORIDE: 104 mmol/L (ref 101–111)
CO2: 22 mmol/L (ref 22–32)
CREATININE: 0.98 mg/dL (ref 0.44–1.00)
Calcium: 8.9 mg/dL (ref 8.9–10.3)
GFR calc non Af Amer: >60 mL/min (ref >60)
Glucose, Bld: 87 mg/dL (ref 65–99)
Potassium: 3.7 mmol/L (ref 3.5–5.1)
Sodium: 138 mmol/L (ref 135–145)

## 2015-06-28 LAB — HIV ANTIBODY (ROUTINE TESTING W REFLEX): HIV SCREEN 4TH GENERATION: NONREACTIVE

## 2015-06-28 LAB — SURGICAL PCR SCREEN
MRSA, PCR: NEGATIVE
STAPHYLOCOCCUS AUREUS: NEGATIVE

## 2015-06-28 SURGERY — INCISION AND DRAINAGE, ABSCESS
Anesthesia: General

## 2015-06-28 MED ORDER — HYDROMORPHONE HCL 1 MG/ML IJ SOLN
INTRAMUSCULAR | Status: AC
  Filled 2015-06-28: qty 1

## 2015-06-28 MED ORDER — OXYCODONE HCL 5 MG PO TABS: 5.0000 mg | ORAL_TABLET | Freq: Once | ORAL | Status: DC | PRN

## 2015-06-28 MED ORDER — ACETAMINOPHEN 160 MG/5ML PO SOLN: 325.0000 mg | ORAL | Status: DC | PRN

## 2015-06-28 MED ORDER — ACETAMINOPHEN 325 MG PO TABS: 325.0000 mg | ORAL_TABLET | ORAL | Status: DC | PRN

## 2015-06-28 MED ORDER — FENTANYL CITRATE (PF) 100 MCG/2ML IJ SOLN
INTRAMUSCULAR | Status: DC | PRN
  Administered 2015-06-28: 50 ug via INTRAVENOUS
  Administered 2015-06-28: 100 ug via INTRAVENOUS
  Administered 2015-06-28 (×2): 50 ug via INTRAVENOUS

## 2015-06-28 MED ORDER — ONDANSETRON HCL 4 MG/2ML IJ SOLN
INTRAMUSCULAR | Status: DC | PRN
  Administered 2015-06-28: 4 mg via INTRAVENOUS

## 2015-06-28 MED ORDER — OXYCODONE HCL 5 MG/5ML PO SOLN: 5.0000 mg | Freq: Once | ORAL | Status: DC | PRN

## 2015-06-28 MED ORDER — IOHEXOL 300 MG/ML  SOLN
75.0000 mL | Freq: Once | INTRAMUSCULAR | Status: AC | PRN
  Administered 2015-06-28: 75 mL via INTRAVENOUS

## 2015-06-28 MED ORDER — FENTANYL CITRATE (PF) 100 MCG/2ML IJ SOLN: 25.0000 ug | INTRAMUSCULAR | Status: DC | PRN

## 2015-06-28 MED ORDER — MIDAZOLAM HCL 2 MG/2ML IJ SOLN
INTRAMUSCULAR | Status: AC
  Filled 2015-06-28: qty 2

## 2015-06-28 MED ORDER — PROPOFOL 10 MG/ML IV BOLUS
INTRAVENOUS | Status: DC | PRN
  Administered 2015-06-28: 50 mg via INTRAVENOUS
  Administered 2015-06-28: 20 mg via INTRAVENOUS
  Administered 2015-06-28: 30 mg via INTRAVENOUS
  Administered 2015-06-28: 50 mg via INTRAVENOUS

## 2015-06-28 MED ORDER — DOUBLE ANTIBIOTIC 500-10000 UNIT/GM EX OINT
TOPICAL_OINTMENT | CUTANEOUS | Status: AC
  Filled 2015-06-28: qty 1

## 2015-06-28 MED ORDER — FENTANYL CITRATE (PF) 250 MCG/5ML IJ SOLN
INTRAMUSCULAR | Status: AC
  Filled 2015-06-28: qty 5

## 2015-06-28 MED ORDER — LIDOCAINE-EPINEPHRINE 1 %-1:100000 IJ SOLN
INTRAMUSCULAR | Status: DC | PRN
  Administered 2015-06-28: 1 mL

## 2015-06-28 MED ORDER — SUCCINYLCHOLINE CHLORIDE 20 MG/ML IJ SOLN
INTRAMUSCULAR | Status: DC | PRN
  Administered 2015-06-28: 60 mg via INTRAVENOUS

## 2015-06-28 MED ORDER — PHENYLEPHRINE HCL 10 MG/ML IJ SOLN
INTRAMUSCULAR | Status: DC | PRN
  Administered 2015-06-28: 80 ug via INTRAVENOUS

## 2015-06-28 MED ORDER — HYDROMORPHONE HCL 1 MG/ML IJ SOLN
0.2500 mg | INTRAMUSCULAR | Status: DC | PRN
  Administered 2015-06-28 (×2): 0.5 mg via INTRAVENOUS

## 2015-06-28 MED ORDER — DEXAMETHASONE SODIUM PHOSPHATE 10 MG/ML IJ SOLN
INTRAMUSCULAR | Status: DC | PRN
  Administered 2015-06-28: 10 mg via INTRAVENOUS

## 2015-06-28 MED ORDER — SODIUM CHLORIDE 0.9 % IV SOLN
INTRAVENOUS | Status: DC
Start: 2015-06-28 — End: 2015-06-28

## 2015-06-28 MED ORDER — LACTATED RINGERS IV SOLN
INTRAVENOUS | Status: DC
  Administered 2015-06-28: 15:00:00 via INTRAVENOUS

## 2015-06-28 MED ORDER — LIDOCAINE-EPINEPHRINE 1 %-1:100000 IJ SOLN
INTRAMUSCULAR | Status: AC
  Filled 2015-06-28: qty 1

## 2015-06-28 MED ORDER — HYDROMORPHONE HCL 1 MG/ML IJ SOLN
0.5000 mg | INTRAMUSCULAR | Status: DC | PRN
  Administered 2015-06-28 – 2015-06-29 (×4): 0.5 mg via INTRAVENOUS
  Filled 2015-06-28 (×4): qty 1

## 2015-06-28 SURGICAL SUPPLY — 45 items
ATTRACTOMAT 16X20 MAGNETIC DRP (DRAPES) IMPLANT
BLADE SURG 15 STRL LF DISP TIS (BLADE) IMPLANT
BLADE SURG 15 STRL SS (BLADE)
BNDG GAUZE ELAST 4 BULKY (GAUZE/BANDAGES/DRESSINGS) IMPLANT
CANISTER SUCTION 2500CC (MISCELLANEOUS) ×3 IMPLANT
CLEANER TIP ELECTROSURG 2X2 (MISCELLANEOUS) IMPLANT
CONT SPEC 4OZ CLIKSEAL STRL BL (MISCELLANEOUS) IMPLANT
CORDS BIPOLAR (ELECTRODE) IMPLANT
COVER SURGICAL LIGHT HANDLE (MISCELLANEOUS) ×3 IMPLANT
DRAIN CHANNEL 7F FF FLAT (WOUND CARE) IMPLANT
DRAIN PENROSE 1/4X12 LTX STRL (WOUND CARE) ×3 IMPLANT
DRAPE PROXIMA HALF (DRAPES) IMPLANT
ELECT COATED BLADE 2.86 ST (ELECTRODE) IMPLANT
ELECT REM PT RETURN 9FT ADLT (ELECTROSURGICAL) ×3
ELECT REM PT RETURN 9FT PED (ELECTROSURGICAL)
ELECTRODE REM PT RETRN 9FT PED (ELECTROSURGICAL) IMPLANT
ELECTRODE REM PT RTRN 9FT ADLT (ELECTROSURGICAL) ×1 IMPLANT
EVACUATOR SILICONE 100CC (DRAIN) IMPLANT
GAUZE SPONGE 4X4 12PLY STRL (GAUZE/BANDAGES/DRESSINGS) IMPLANT
GLOVE BIOGEL M 7.0 STRL (GLOVE) ×6 IMPLANT
GOWN STRL REUS W/ TWL LRG LVL3 (GOWN DISPOSABLE) ×2 IMPLANT
GOWN STRL REUS W/TWL LRG LVL3 (GOWN DISPOSABLE) ×4
KIT BASIN OR (CUSTOM PROCEDURE TRAY) ×3 IMPLANT
KIT ROOM TURNOVER OR (KITS) ×3 IMPLANT
LOCATOR NERVE 3 VOLT (DISPOSABLE) IMPLANT
NEEDLE HYPO 25GX1X1/2 BEV (NEEDLE) ×3 IMPLANT
NS IRRIG 1000ML POUR BTL (IV SOLUTION) ×3 IMPLANT
PAD ARMBOARD 7.5X6 YLW CONV (MISCELLANEOUS) ×6 IMPLANT
PENCIL BUTTON HOLSTER BLD 10FT (ELECTRODE) ×3 IMPLANT
SPONGE GAUZE 4X4 12PLY STER LF (GAUZE/BANDAGES/DRESSINGS) ×3 IMPLANT
STAPLER VISISTAT 35W (STAPLE) IMPLANT
SUT ETHILON 3 0 PS 1 (SUTURE) ×3 IMPLANT
SUT ETHILON 5 0 PS 2 18 (SUTURE) IMPLANT
SUT SILK 2 0 FS (SUTURE) IMPLANT
SUT SILK 3 0 REEL (SUTURE) IMPLANT
SUT VIC AB 3-0 PS2 18 (SUTURE)
SUT VIC AB 3-0 PS2 18XBRD (SUTURE) IMPLANT
SUT VIC AB 4-0 P-3 18X BRD (SUTURE) IMPLANT
SUT VIC AB 4-0 P3 18 (SUTURE)
SWAB COLLECTION DEVICE MRSA (MISCELLANEOUS) ×3 IMPLANT
TAPE CLOTH SURG 4X10 WHT LF (GAUZE/BANDAGES/DRESSINGS) ×3 IMPLANT
TOWEL OR 17X24 6PK STRL BLUE (TOWEL DISPOSABLE) ×3 IMPLANT
TRAY ENT MC OR (CUSTOM PROCEDURE TRAY) ×3 IMPLANT
TUBE ANAEROBIC SPECIMEN COL (MISCELLANEOUS) ×3 IMPLANT
WATER STERILE IRR 1000ML POUR (IV SOLUTION) IMPLANT

## 2015-06-28 NOTE — Progress Notes (Signed)
Subjective: This morning, she reports feeling worse this morning. She is still using suction to evacuate her secretions and is having difficulty opening her mouth fully.  Repeat CT imaging of the neck now notable for 15 x 20mm abscess in the right floor of the mouth consistent with Ludwig's angina.   Objective: Vital signs in last 24 hours: Filed Vitals:   06/27/15 1834 06/27/15 2210 06/27/15 2213 06/28/15 0537  BP: 119/73 101/70  113/71  Pulse: 83 86  81  Temp: 100.9 F (38.3 C) 99.3 F (37.4 C)  99.5 F (37.5 C)  TempSrc: Oral Oral  Oral  Resp: Height:    (1.6 m)   Weight:   135 lb 8 oz (61.462 kg)   SpO2: 100% 97%  100%   Weight change:   Intake/Output Summary (Last 24 hours) at 06/28/15 1209 Last data filed at 06/27/15 1859  Gross per 24 hour  Intake 370.83 ml  Output      0 ml  Net 370.83 ml   General: young African American woman, tired-appearing, resting in bed, no respiratory distress HEENT: EOMI, no scleral icterus, unable to visualize posterior oropharynx, swelling noted bilaterally with tenderness to light palpation worse on the right side Cardiac: RRR, no rubs, murmurs or gallops Pulm: clear to auscultation bilaterally, no wheezes, rales, or rhonchi Abd: soft, nontender, nondistended, BS present Ext: warm and well perfused, no pedal edema Neuro: responds to questions appropriately; moving all extremities freely  Lab Results: Basic Metabolic Panel:  Recent Labs Lab 06/27/15 1230 06/28/15 0530  NA 135 138  K 3.8 3.7  CL 103 104  CO2 20* 22  GLUCOSE 97 87  BUN 9 6  CREATININE 1.08* 0.98  CALCIUM 9.4 8.9   CBC:  Recent Labs Lab 06/27/15 1230 06/28/15 0530  WBC 17.3* 15.1*  NEUTROABS 15.1* 11.8*  HGB 13.8 12.5  HCT 41.8 38.7  MCV 88.7 89.2  PLT 173 176   Micro Results: No results found for this or any previous visit (from the past 240 hour(s)). Studies/Results: Ct Soft Tissue Neck W Contrast  06/27/2015  CLINICAL DATA:   Right lower jaw pain and swelling yesterday which is worsening. Difficulty swallowing. Evaluation for deep space infection. EXAM: CT NECK WITH CONTRAST TECHNIQUE: Multidetector CT imaging of the neck was performed using the standard protocol following the bolus administration of intravenous contrast. CONTRAST:  75mL OMNIPAQUE IOHEXOL 300 MG/ML  SOLN COMPARISON:  None. FINDINGS: Pharynx and larynx: Multiple dental caries are noted with most prominent involvement of the maxillary greater than mandibular molars bilaterally. Mild periapical lucencies are noted involving the left maxillary third molar and right mandibular central incisor. Moderate inflammatory change is seen in the right sublingual and submandibular spaces. Fluid tracking in the right sublingual space extends over a length of approximately 3.5 cm and measures approximately 6 mm in thickness with partial rim enhancement. No well-defined, space-occupying fluid collection/abscess is identified. There is thickening of the right platysma. Inflammatory changes extend into the right parapharyngeal space. No pharyngeal mucosal lesion is identified. The larynx is unremarkable. Salivary glands: Submandibular and parotid glands are unremarkable. Thyroid: Unremarkable. Lymph nodes: Enlarged level I and II lymph nodes bilaterally are likely reactive and measure up to 1 cm in short axis. Vascular: Major vascular structures of the neck appear patent. Limited intracranial: The visualized portion of the brain is unremarkable. Visualized orbits: Unremarkable. Mastoids and visualized paranasal sinuses: Mild mucosal thickening in the left maxillary sinus. Hypoplastic left  frontal sinus. Clear mastoid air cells. Pre dissection Skeleton: No acute osseous abnormality identified. Upper chest: Clear lung apices. IMPRESSION: 1. Moderate inflammatory changes consistent with infection in the right sublingual, submandibular, and parapharyngeal spaces. Likely developing, small right  sublingual space abscess. 2. Reactive upper cervical lymphadenopathy. Electronically Signed   By: Sebastian Ache M.D.   On: 06/27/2015 14:20   Medications: I have reviewed the patient's current medications. Scheduled Meds: . ampicillin-sulbactam (UNASYN) IV  3 g Intravenous Q6H  . sodium chloride flush  3 mL Intravenous Q12H   Continuous Infusions:  PRN Meds:.acetaminophen **OR** acetaminophen, HYDROmorphone (DILAUDID) injection Assessment/Plan:  Ms. 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  Ludwig's angina: As noted on repeat CT imaging this morning ordered for worsening physical exam findings. ENT aware with plan for surgical management. -Change to NPO for planned drainage today -Continue Unasyn IV -Continue Dilaudid 0.5mg  every 4 hours as needed for pain -Continue NS @ 100cc/hr since due to contrast load x 2 in the past 48 hours  Dental caries: CT imaging notable for 3rd molar involvement bilaterally [upper and lower]. -Follow-up with dentist following on discharge  Dispo: Disposition is deferred at this time, awaiting improvement of current medical problems.  Anticipated discharge in approximately 1-2 day(s).   The patient does not know have a current PCP (No primary care provider on file.) and does not know need an [facility] hospital follow-up appointment after discharge.  The patient does not know have transportation limitations that hinder transportation to clinic appointments.  .Services Needed at time of discharge: Y = Yes, Blank = No PT:   OT:   RN:   Equipment:   Other:       Beather Arbour, MD 06/28/2015, 12:09 PM

## 2015-06-28 NOTE — Anesthesia Postprocedure Evaluation (Signed)
Anesthesia Post Note  Patient: Denise Banks  Procedure(s) Performed: Procedure(s) (LRB): INCISION AND DRAINAGE OF NECK/FLOOR OF MOUTH ABSCESS (N/A)  Patient location during evaluation: PACU Anesthesia Type: General Level of consciousness: awake Pain management: pain level controlled Vital Signs Assessment: post-procedure vital signs reviewed and stable Respiratory status: spontaneous breathing, respiratory function stable and patient connected to nasal cannula oxygen Cardiovascular status: stable Postop Assessment: no signs of nausea or vomiting Anesthetic complications: no    Last Vitals:  Filed Vitals:   06/28/15 1626 06/28/15 1653  BP: 112/78 111/67  Pulse: 107 95  Temp: 37.1 C 37.4 C  Resp: 24 16    Last Pain:  Filed Vitals:   06/28/15 1728  PainSc: 8                  Akeem Heppler

## 2015-06-28 NOTE — Op Note (Signed)
NAMEALETHA, ALLEBACH           ACCOUNT NO.:  [id number]  MEDICAL RECORD NO.:  [id number]  LOCATION:  [id number]                        FACILITY:  [facility]  PHYSICIAN:  Kinnie Scales. Annalee Genta, M.D.DATE OF BIRTH:  [birth year]  DATE OF PROCEDURE:  06/28/2015 DATE OF DISCHARGE:                              OPERATIVE REPORT   PREOPERATIVE DIAGNOSIS:  Right perimandibular abscess.  POSTOPERATIVE DIAGNOSIS:  Right perimandibular abscess.  INDICATION FOR SURGERY:  Right perimandibular abscess.  PROCEDURE:  Incision and drainage of right perimandibular abscess.  ANESTHESIA:  General endotracheal.  SURGEON:  Kinnie Scales. Annalee Genta, MD.  COMPLICATIONS:  None.  ESTIMATED BLOOD LOSS:  Minimal.  DISPOSITION:  The patient transferred from the operating room to the recovery room in stable condition.  BRIEF HISTORY:  The patient is an otherwise healthy 30 year old, black female, who was admitted to [facility] on the evening of June 27, 2015, with progressive symptoms of odynophagia, right submental swelling, and fever.  The patient had a 1-day history of increasing symptoms of pain and swelling in the right perimandibular region.  She had previous dental infections which have been treated with oral antibiotics.  No recent trauma or other significant findings.  A followup CT scan showed a developing abscess in the right medial perimandibular space adjacent to the floor of the mouth.  The patient had increasing symptoms of odynophagia.  Given her history and CT findings, I recommended incision and drainage of the perimandibular abscess under general anesthesia.  The risks and benefits were discussed with the patient who understood and agreed with our plan for surgery which is scheduled on an emergency basis under general anesthesia at [facility] OR.  DESCRIPTION OF PROCEDURE:  The patient was brought to the operating room on June 28, 2015 and placed in supine position on  the operating table.  General endotracheal anesthesia was established without difficulty.  When the patient adequately anesthetized, she was positioned and prepped and draped.  A surgical time-out was then performed with correct identification of the surgical procedure and the patient.  She was injected with 1 mL of 1% lidocaine 1:100,000 solution of epinephrine which we injected in the anterior submandibular space adjacent to the inferior border of the mandible.  After allowing adequate time for vasoconstriction, hemostasis and a 1 cm incision was created in a preexisting skin crease.  This was carried through the skin underlying subcutaneous tissue.  A hemostat was then used to carefully explore the medial perimandibular region.  Areas of loculation were identified and moderate amount of purulent material approximately 30 mL was drained through the incision.  There was no further pus or purulent material.  Cultures and sensitivity were obtained.  There was no bleeding.  A quarter-inch Penrose drain was then placed at the depth of the abscess cavity and sutured to the skin with a 3-0 Ethilon suture.  The patient was then awakened from anesthetic and was extubated.  She was transferred to the operating room to the recovery room in stable condition.  There were no complications and no blood loss.          ______________________________ Kinnie Scales Annalee Genta, M.D.     DLS/MEDQ  D:  04/54/0981  T:  06/28/2015  Job:  [id number]

## 2015-06-28 NOTE — Transfer of Care (Signed)
Immediate Anesthesia Transfer of Care Note  Patient: Denise Banks  Procedure(s) Performed: Procedure(s): INCISION AND DRAINAGE OF NECK/FLOOR OF MOUTH ABSCESS (N/A)  Patient Location: PACU  Anesthesia Type:General  Level of Consciousness: awake, alert  and oriented  Airway & Oxygen Therapy: Patient Spontanous Breathing  Post-op Assessment: Report given to RN and Post -op Vital signs reviewed and stable  Post vital signs: Reviewed and stable  Last Vitals:  Filed Vitals:   06/28/15 0537 06/28/15 1320  BP: 113/71 115/91  Pulse: 81 63  Temp: 37.5 C 37.3 C  Resp: 20 20    Complications: No apparent anesthesia complications

## 2015-06-28 NOTE — Brief Op Note (Signed)
06/27/2015 - 06/28/2015  3:53 PM  PATIENT:  Roanna Raider Robello  30 y.o. female  PRE-OPERATIVE DIAGNOSIS:  neck/floor of mouth abscess  POST-OPERATIVE DIAGNOSIS:  neck/floor of mouth abscess  PROCEDURE:  Procedure(s): INCISION AND DRAINAGE OF NECK/FLOOR OF MOUTH ABSCESS (N/A)  SURGEON:  Surgeon(s) and Role:    * Osborn Coho, MD - Primary  PHYSICIAN ASSISTANT:   ASSISTANTS: none   ANESTHESIA:   general  EBL:  Total I/O In: 500 [I.V.:500] Out: 5 [Blood:5]  BLOOD ADMINISTERED:none  DRAINS: Penrose drain in the right perimandibular space   LOCAL MEDICATIONS USED:  LIDOCAINE  and Amount: 1 ml  SPECIMEN:  Source of Specimen:  Rt perimandibular abscess  DISPOSITION OF SPECIMEN:  Micro  COUNTS:  YES  TOURNIQUET:  * No tourniquets in log *  DICTATION: .Other Dictation: Dictation Number [id number]  PLAN OF CARE: Admit to inpatient   PATIENT DISPOSITION:  PACU - hemodynamically stable.   Delay start of Pharmacological VTE agent (>24hrs) due to surgical blood loss or risk of bleeding: no

## 2015-06-28 NOTE — Progress Notes (Signed)
Subjective: Patient reports worsening pain overnight.  Patient is able to tolerate clear liquids, but had pain with tomato soup.  Has been using suction to help with secretions because of pain with swallowing.  Finds it more difficult to open her mouth today than yesterday.   Objective: Vital signs in last 24 hours: Filed Vitals:   06/27/15 1834 06/27/15 2210 06/27/15 2213 06/28/15 0537  BP: 119/73 101/70  113/71  Pulse: 83 86  81  Temp: 100.9 F (38.3 C) 99.3 F (37.4 C)  99.5 F (37.5 C)  TempSrc: Oral Oral  Oral  Resp: Height:    (1.6 m)   Weight:   61.462 kg (135 lb 8 oz)   SpO2: 100% 97%  100%   General Appearance: Young woman, uncomfortable appearing, lying in hospital bed, Alert, cooperative Head: Normocephalic, without obvious abnormality, atraumatic Mouth/Throat: Painful to open mouth and stick out tongue, poor dentition throughout, receeding gumline of lower right central incisor, old fracture of lower left wisdom tooth, no obvious trauma to right lower molars, no new obvious swelling in her mouth or around teeth Neck: Swelling has increased since admission and is bilateral in appearance in comparison to predominantly right sided swelling yesterday, very tender to light palpation, trachea midline Face: Appears to have new swelling around her mouth, more notable on the right than left Lungs: Clear to auscultation bilaterally, no wheezing or crackles,  respirations unlabored Heart: Regular rate and rhythm, S1 and S2 normal, no murmur, rub   or gallop Abdomen: Soft, non-tender, bowel sounds active all four quadrants,no masses, no organomegaly Extremities: Extremities normal, atraumatic, no cyanosis or edema Pulses: 2+ and symmetric in all extremities   Lab Results:  Basic Metabolic Panel:  Recent Labs  40/98/11 1230 06/28/15 0530  NA 135 138  K 3.8 3.7  CL 103 104  CO2 20* 22  GLUCOSE 97 87  BUN 9 6  CREATININE 1.08* 0.98  CALCIUM 9.4 8.9    CBC:  Recent Labs  06/27/15 1230 06/28/15 0530  WBC 17.3* 15.1*  NEUTROABS 15.1* 11.8*  HGB 13.8 12.5  HCT 41.8 38.7  MCV 88.7 89.2  PLT 173 176   HIV: nonreactive   Medications:  Scheduled Meds: . ampicillin-sulbactam (UNASYN) IV  3 g Intravenous Q6H  . sodium chloride flush  3 mL Intravenous Q12H   Continuous Infusions:  PRN Meds:.acetaminophen **OR** acetaminophen, HYDROmorphone (DILAUDID) injection  Assessment/Plan:  Ms. Quashie is a 30 year old woman with past medical history notable for blood transfusion at age 29 for heavy menstrual bleeding who presents with complaint of painful right sided neck swelling and odynophagia for 1 day who is found to have elevated WBC of 17.3, elevated serum Cr, and moderate inflammatory changes consistent with infection in the R sublingual, submandibular and parapharyngeal spaces and reactive cervical lymphadenopathy on CT.  #Deep Neck Space Infection: Patient was found to have elevated WBC 17.3, moderate inflammatory changes consistent with infection in the R sublingual, submandibular and parapharyngeal spaces and reactive cervical lymphadenopathy on CT. ENT was consulted upon admission and recommended medical management at this time.  Given patient's poor dentition, the source of infection is likely dental. Patient told to notify RN if she experiences difficulty swallowing her own saliva or difficulty breathing.  - ENT consulted re surgical management vs antibiotics, recommendations appreciated  - Repeat CT soft tissue neck w contrast as patient's swelling, odynophagia, and mouth opening worsened  - Continue ampicillin-sulbactam (unasyn) 3 g IV  Q8H  - Dilaudid 0.5 mg IV Q4H prn moderate to severe pain - May consider tordol for mild pain in the future given the inflammatory nature of the process causing her pain, however in setting of recent elevated serum cr and requiring repeat CT w contrast today - Follow WBC on am CBC  #Elevated  Serum Creatinine: Serum Cr today down trended to 0.98. Patient was found to have serum Cr of 1.08 on BMP at admission.. No previous baseline with which to compare. Likely a results of poor po intake since yesterday.  - IVF Normal Saline @ 125 ml/hr for 12 hr - BMP in the am  FENGI/DVT Ppx:  Fluids: NS @ 100 ml/hr Electrolytes: No need to replete at this time Nutrition: Full liquid diet GI: None DVT ppx: SCDs as patient is ambulatory and pending ENT recs re surgical management    Dispo: Continue to monitor closely on med/surg    This is a Psychologist, occupational Note.  The care of the patient was discussed with Dr. Heywood Iles and the assessment and plan formulated with their assistance.  Please see their attached note for official documentation of the daily encounter.     Lacie Scotts, Med Student 06/28/2015, 6:51 AM

## 2015-06-28 NOTE — Anesthesia Preprocedure Evaluation (Addendum)
Anesthesia Evaluation  Patient identified by MRN, date of birth, ID band Patient awake    Airway Mallampati: IV  TM Distance: >3 FB Neck ROM: Full  Mouth opening: Limited Mouth Opening  Dental  (+) Poor Dentition   Pulmonary Current Smoker,    breath sounds clear to auscultation       Cardiovascular  Rhythm:Regular Rate:Tachycardia     Neuro/Psych    GI/Hepatic   Endo/Other    Renal/GU      Musculoskeletal   Abdominal   Peds  Hematology   Anesthesia Other Findings   Reproductive/Obstetrics                            Anesthesia Physical Anesthesia Plan  ASA: III and emergent  Anesthesia Plan: General   Post-op Pain Management:    Induction: Intravenous  Airway Management Planned: Oral ETT and Nasal ETT  Additional Equipment: None  Intra-op Plan:   Post-operative Plan: Extubation in OR  Informed Consent: I have reviewed the patients History and Physical, chart, labs and discussed the procedure including the risks, benefits and alternatives for the proposed anesthesia with the patient or authorized representative who has indicated his/her understanding and acceptance.   Dental advisory given  Plan Discussed with: CRNA and Surgeon  Anesthesia Plan Comments:         Anesthesia Quick Evaluation

## 2015-06-28 NOTE — Progress Notes (Signed)
   ENT Progress Note: HD #1   Subjective: Cont swelling and odynophagia  Objective: Vital signs in last 24 hours: Temp:  [99.3 F (37.4 C)-100.9 F (38.3 C)] 99.5 F (37.5 C) (02/18 0537) Pulse Rate:  [70-98] 81 (02/18 0537) Resp:  [20-22] 20 (02/18 0537) BP: (101-124)/(70-83) 113/71 mmHg (02/18 0537) SpO2:  [97 %-100 %] 100 % (02/18 0537) Weight:  [61.462 kg (135 lb 8 oz)] 61.462 kg (135 lb 8 oz) (02/17 2213) Weight change:  Last BM Date: 06/27/15  Intake/Output from previous day: 02/17 0701 - 02/18 0700 In: 370.8 [I.V.:270.8; IV Piggyback:100] Out: -  Intake/Output this shift:    Labs:  Recent Labs  06/27/15 1230 06/28/15 0530  WBC 17.3* 15.1*  HGB 13.8 12.5  HCT 41.8 38.7  PLT 173 176    Recent Labs  06/27/15 1230 06/28/15 0530  NA 135 138  K 3.8 3.7  CL 103 104  CO2 20* 22  GLUCOSE 97 87  BUN 9 6  CALCIUM 9.4 8.9    Studies/Results: Ct Soft Tissue Neck W Contrast  06/27/2015  CLINICAL DATA:  Right lower jaw pain and swelling yesterday which is worsening. Difficulty swallowing. Evaluation for deep space infection. EXAM: CT NECK WITH CONTRAST TECHNIQUE: Multidetector CT imaging of the neck was performed using the standard protocol following the bolus administration of intravenous contrast. CONTRAST:  75mL OMNIPAQUE IOHEXOL 300 MG/ML  SOLN COMPARISON:  None. FINDINGS: Pharynx and larynx: Multiple dental caries are noted with most prominent involvement of the maxillary greater than mandibular molars bilaterally. Mild periapical lucencies are noted involving the left maxillary third molar and right mandibular central incisor. Moderate inflammatory change is seen in the right sublingual and submandibular spaces. Fluid tracking in the right sublingual space extends over a length of approximately 3.5 cm and measures approximately 6 mm in thickness with partial rim enhancement. No well-defined, space-occupying fluid collection/abscess is identified. There is  thickening of the right platysma. Inflammatory changes extend into the right parapharyngeal space. No pharyngeal mucosal lesion is identified. The larynx is unremarkable. Salivary glands: Submandibular and parotid glands are unremarkable. Thyroid: Unremarkable. Lymph nodes: Enlarged level I and II lymph nodes bilaterally are likely reactive and measure up to 1 cm in short axis. Vascular: Major vascular structures of the neck appear patent. Limited intracranial: The visualized portion of the brain is unremarkable. Visualized orbits: Unremarkable. Mastoids and visualized paranasal sinuses: Mild mucosal thickening in the left maxillary sinus. Hypoplastic left frontal sinus. Clear mastoid air cells. Pre dissection Skeleton: No acute osseous abnormality identified. Upper chest: Clear lung apices. IMPRESSION: 1. Moderate inflammatory changes consistent with infection in the right sublingual, submandibular, and parapharyngeal spaces. Likely developing, small right sublingual space abscess. 2. Reactive upper cervical lymphadenopathy. Electronically Signed   By: Sebastian Ache M.D.   On: 06/27/2015 14:20     PHYSICAL EXAM: Soft tissue erythema and swelling Rt submand and submental region No trismus  tol secretions, airway stable   Assessment/Plan: WBC and fever trending down on current regimen Cont Abx, pain meds and IVF Will follow    Denise Banks 06/28/2015, 9:29 AM

## 2015-06-28 NOTE — Anesthesia Procedure Notes (Signed)
Procedure Name: Intubation Date/Time: 06/28/2015 3:37 PM Performed by: Daiva Eves Pre-anesthesia Checklist: Patient identified, Timeout performed, Emergency Drugs available, Suction available and Patient being monitored Patient Re-evaluated:Patient Re-evaluated prior to inductionOxygen Delivery Method: Circle system utilized Preoxygenation: Pre-oxygenation with 100% oxygen Intubation Type: IV induction Ventilation: Mask ventilation without difficulty Laryngoscope Size: Mac and 3 Grade View: Grade II Tube type: Oral Tube size: 7.0 mm Number of attempts: 1 Airway Equipment and Method: Stylet Placement Confirmation: ETT inserted through vocal cords under direct vision,  breath sounds checked- equal and bilateral,  positive ETCO2 and CO2 detector Secured at: 22 cm Tube secured with: Tape Dental Injury: Teeth and Oropharynx as per pre-operative assessment  Comments: Grade 2 view with cricoid pressure 2/2 swollen neck. Suspect grade 1 view otherwise.

## 2015-06-29 DIAGNOSIS — Z9889 Other specified postprocedural states: Secondary | ICD-10-CM

## 2015-06-29 LAB — CBC WITH DIFFERENTIAL/PLATELET
Basophils Absolute: 0 10*3/uL (ref 0.0–0.1)
Basophils Relative: 0 %
Eosinophils Absolute: 0 10*3/uL (ref 0.0–0.7)
Eosinophils Relative: 0 %
HEMATOCRIT: 36.6 % (ref 36.0–46.0)
Hemoglobin: 12.2 g/dL (ref 12.0–15.0)
LYMPHS PCT: 4 %
Lymphs Abs: 0.8 10*3/uL (ref 0.7–4.0)
MCH: 29.1 pg (ref 26.0–34.0)
MCHC: 33.3 g/dL (ref 30.0–36.0)
MCV: 87.4 fL (ref 78.0–100.0)
MONO ABS: 0.5 10*3/uL (ref 0.1–1.0)
MONOS PCT: 3 %
NEUTROS ABS: 16.4 10*3/uL — AB (ref 1.7–7.7)
Neutrophils Relative %: 93 %
Platelets: 159 10*3/uL (ref 150–400)
RBC: 4.19 MIL/uL (ref 3.87–5.11)
RDW: 12.4 % (ref 11.5–15.5)
WBC: 17.7 10*3/uL — ABNORMAL HIGH (ref 4.0–10.5)

## 2015-06-29 LAB — BASIC METABOLIC PANEL
Anion gap: 10 (ref 5–15)
BUN: 7 mg/dL (ref 6–20)
CO2: 23 mmol/L (ref 22–32)
CREATININE: 0.9 mg/dL (ref 0.44–1.00)
Calcium: 9.1 mg/dL (ref 8.9–10.3)
Chloride: 104 mmol/L (ref 101–111)
GFR calc Af Amer: >60 mL/min (ref >60)
GFR calc non Af Amer: >60 mL/min (ref >60)
Glucose, Bld: 135 mg/dL — ABNORMAL HIGH (ref 65–99)
Potassium: 4.2 mmol/L (ref 3.5–5.1)
Sodium: 137 mmol/L (ref 135–145)

## 2015-06-29 MED ORDER — PNEUMOCOCCAL VAC POLYVALENT 25 MCG/0.5ML IJ INJ
0.5000 mL | INJECTION | INTRAMUSCULAR | Status: AC
  Administered 2015-06-30: 0.5 mL via INTRAMUSCULAR
  Filled 2015-06-29: qty 0.5

## 2015-06-29 MED ORDER — INFLUENZA VAC SPLIT QUAD 0.5 ML IM SUSY
0.5000 mL | PREFILLED_SYRINGE | INTRAMUSCULAR | Status: AC
  Administered 2015-06-30: 0.5 mL via INTRAMUSCULAR

## 2015-06-29 MED ORDER — OXYCODONE-ACETAMINOPHEN 5-325 MG PO TABS
1.0000 | ORAL_TABLET | ORAL | Status: DC | PRN
  Administered 2015-06-29: 1 via ORAL
  Administered 2015-06-29: 2 via ORAL
  Administered 2015-06-30: 1 via ORAL
  Administered 2015-06-30: 2 via ORAL
  Filled 2015-06-29: qty 1
  Filled 2015-06-29: qty 2
  Filled 2015-06-29: qty 1
  Filled 2015-06-29: qty 2

## 2015-06-29 MED ORDER — ACETAMINOPHEN 325 MG PO TABS: 650.0000 mg | ORAL_TABLET | Freq: Four times a day (QID) | ORAL | Status: DC | PRN

## 2015-06-29 MED ORDER — ACETAMINOPHEN 650 MG RE SUPP: 650.0000 mg | Freq: Four times a day (QID) | RECTAL | Status: DC | PRN

## 2015-06-29 NOTE — Discharge Summary (Signed)
Name: Denise Banks MRN: [id number] DOB: Mar 26, 1986 30 y.o. PCP: No primary care provider on file.  Date of Admission: 06/27/2015 12:06 PM Date of Discharge: 06/30/15 Attending Physician: Dr. Oscar La  Discharge Diagnosis:  Ludwig's angina Dental caries   Discharge Medications:   Medication List    STOP taking these medications        HYDROcodone-acetaminophen 5-325 MG tablet  Commonly known as:  NORCO/VICODIN      TAKE these medications        amoxicillin-clavulanate 875-125 MG tablet  Commonly known as:  AUGMENTIN  Take 1 tablet by mouth 2 (two) times daily.     naproxen 500 MG tablet  Commonly known as:  NAPROSYN  Take 1 tablet (500 mg total) by mouth 2 (two) times daily.     oxyCODONE-acetaminophen 5-325 MG tablet  Commonly known as:  PERCOCET/ROXICET  Take 1 tablet by mouth every 6 (six) hours as needed for moderate pain.        Disposition and follow-up:   Denise Banks was discharged from [facility] in Stable condition.  At the hospital follow up visit please address:  Ludwig's angina: resolution of swelling, pain  Dental caries: follow-up with dentist  Follow-up Appointments: Follow-up Information    Follow up with [facility]. Go on 07/08/2015.   Why:  You have an appointment on 07/08/15 at 3:15 pm with Dr. Mikey Bussing for hospital follow up and to establish care with a primary care physician.    Contact information:   [address] [place] [address] [phone]      Discharge Instructions: Discharge Instructions    Call MD for:  difficulty breathing, headache or visual disturbances    Complete by:  As directed      Call MD for:  extreme fatigue    Complete by:  As directed      Call MD for:  hives    Complete by:  As directed      Call MD for:  persistant dizziness or light-headedness    Complete by:  As directed      Call MD for:  persistant nausea and vomiting     Complete by:  As directed      Call MD for:  redness, tenderness, or signs of infection (pain, swelling, redness, odor or green/yellow discharge around incision site)    Complete by:  As directed      Call MD for:  severe uncontrolled pain    Complete by:  As directed      Call MD for:  temperature >100.4    Complete by:  As directed      Diet - low sodium heart healthy    Complete by:  As directed      Increase activity slowly    Complete by:  As directed            Consultations:    Procedures Performed:  Ct Soft Tissue Neck W Contrast  06/28/2015  ADDENDUM REPORT: 06/28/2015 12:24 ADDENDUM: These results were called by telephone at the time of interpretation on 06/28/2015 at 12:23 pm to Dr. Annalee Genta , who verbally acknowledged these results. Electronically Signed   By: Marlan Palau M.D.   On: 06/28/2015 12:24  06/28/2015  CLINICAL DATA:  Submandibular abscess.  Worsening pain EXAM: CT NECK WITH CONTRAST TECHNIQUE: Multidetector CT imaging of the neck was performed using the standard protocol following the bolus administration of intravenous contrast. CONTRAST:  75mL OMNIPAQUE IOHEXOL 300 MG/ML  SOLN COMPARISON:  CT neck 06/27/2015 FINDINGS: Pharynx and larynx: Progressive soft tissue swelling in the floor of the mouth on the right. Developing abscess now identified which has enlarged in the interval. Rim enhancing fluid collection in the submandibular and sublingual space has enlarged significantly in the interval now measuring approximately 15 by 30 mm. This is located anterior and inferior to the sublingual gland in the floor of the mouth. Increased edema in the subcutaneous tissues below the chin. Increase in edema inferior to the submandibular gland bilaterally. Dental caries involving the third molars bilaterally upper and lower. This is most severe right lower third molar. No bony destruction. No airway compromise.  Larynx intact. Salivary glands: Parotid gland normal bilaterally.  Submandibular gland shows normal size and density without mass or abscess or stone. Fluid around the submandibular gland is felt to be reactive from the infection in the floor of the mouth on the right. Thyroid: Normal thyroid. Increased subcutaneous edema extending into the anterior neck compared with yesterday. Lymph nodes: Enlarged lymph nodes in the neck bilaterally enhance homogeneously and are consistent with reactive adenopathy from infection. Mild progression. Vascular: Jugular vein patent bilaterally. Carotid artery patent bilaterally. Limited intracranial: Negative Visualized orbits: Negative Mastoids and visualized paranasal sinuses: Mild mucosal edema in the left maxillary sinus. Remaining sinuses clear. Skeleton: No fracture.  No bony destruction. Upper chest: Lung apices clear. No abscess in the superior mediastinum. 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 Abscess in the right floor of the mouth now measures approximately 15 x 20 mm. Increase soft tissue edema involving the chin, submandibular region, and anterior neck. No airway compromise at this time. Electronically Signed: By: Marlan Palau M.D. On: 06/28/2015 12:07   Ct Soft Tissue Neck W Contrast  06/27/2015  CLINICAL DATA:  Right lower jaw pain and swelling yesterday which is worsening. Difficulty swallowing. Evaluation for deep space infection. EXAM: CT NECK WITH CONTRAST TECHNIQUE: Multidetector CT imaging of the neck was performed using the standard protocol following the bolus administration of intravenous contrast. CONTRAST:  75mL OMNIPAQUE IOHEXOL 300 MG/ML  SOLN COMPARISON:  None. FINDINGS: Pharynx and larynx: Multiple dental caries are noted with most prominent involvement of the maxillary greater than mandibular molars bilaterally. Mild periapical lucencies are noted involving the left maxillary third molar and right mandibular central incisor. Moderate  inflammatory change is seen in the right sublingual and submandibular spaces. Fluid tracking in the right sublingual space extends over a length of approximately 3.5 cm and measures approximately 6 mm in thickness with partial rim enhancement. No well-defined, space-occupying fluid collection/abscess is identified. There is thickening of the right platysma. Inflammatory changes extend into the right parapharyngeal space. No pharyngeal mucosal lesion is identified. The larynx is unremarkable. Salivary glands: Submandibular and parotid glands are unremarkable. Thyroid: Unremarkable. Lymph nodes: Enlarged level I and II lymph nodes bilaterally are likely reactive and measure up to 1 cm in short axis. Vascular: Major vascular structures of the neck appear patent. Limited intracranial: The visualized portion of the brain is unremarkable. Visualized orbits: Unremarkable. Mastoids and visualized paranasal sinuses: Mild mucosal thickening in the left maxillary sinus. Hypoplastic left frontal sinus. Clear mastoid air cells. Pre dissection Skeleton: No acute osseous abnormality identified. Upper chest: Clear lung apices. IMPRESSION: 1. Moderate inflammatory changes consistent with infection in the right sublingual, submandibular, and parapharyngeal spaces. Likely developing, small right sublingual space abscess. 2. Reactive upper cervical  lymphadenopathy. Electronically Signed   By: Sebastian Ache M.D.   On: 06/27/2015 14:20   Admission HPI: Denise Banks is a 30 year old woman with past medical history of blood transfusion at age 83 for heavy menstrual bleeding who presents to the hospital with complaints of right neck pain and swelling since yesterday. Patient reports that while she was at work yesterday, she noticed a "small knot" on the right side of her neck.  Since yesterday the size has gotten bigger and it has become painful.  She reports that she is able to swallow her own secretions but finds swallowing to be painful.   She has also had difficulty opening her mouth and reports decreased PO intake since yesterday.  Her sister told her that he voice didn't not sound normal.  She tried ibuprofen for the pain, but it did not work.  She has found movements like leaning forward and mouth opening make her pain worse. 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  No recent travel.  Last dental work was in 01/2015.  Not able to remember a specific event causing pain in her mouth (food, trauma, etc).  Patient endorses slight headache and mild right ear pain.  Patient denies changes in vision or hearing. Denies fever and chills.  Denies chest pain, shortness of breath, abdominal pain, diarrhea, constipation and urinary symptoms.    Recent history includes visit to ED in 01/2015 for dental pain and was found to have old fracture of left lower molar and multiple dental carries.  No antibiotics were given at that visit and patient was discharged from ED with referral to dentistry.  Hospital Course by problem list:  Ludwig's Angina: Denise Banks presented with painful neck swelling and pain with swallowing and was found to have an elevated WBC 17.3 and infection in the right sublingual, submandibular, and parapharyngeal spaces on CT soft tissue of the neck.  Patient was able to tolerate her own secretions and did not have difficulty breathing.  She was stared on ampicillin-sulbactam (unasyn) 3g IV every 8 hours on 07/25/15.  Patient was also given dilaudid 0.5 mg IV as needed for pain. ENT was consulted to evaluate need for surgical management versus medical management with antibiotics.  Upon reevaluation the next morning, patient's swelling appeared to have worsened, so a repeat CT soft tissue face was ordered and showed interval progression of infection with evidence of abscess consistent with Ludwig's angina. Patient was taken to the OR on 06/28/15 for drainage of abscess.  Patient improved dramatically after OR  drainage of abscess.  Patient was able to eat, drink and tolerate PO pain medications by post-op day 2. Upon discharge, patient to start Augmentin 875-125 mg PO BID and will finish 2 week course of antibiotics on 07/11/15.  Patient can take Percocet 5-325 mg PO TID for pain for 3 days following discharge. Patient to follow up with [facility] to establish a primary care provider.    Dental Caries:  On CT imaging, patient was found to have dental caries involving the third molars bilaterally upper and lower. This is most severe right lower third molar. No bony destruction was appreciated.  Dental infection is the likely source for the deep neck space infection described above.  Upon discharge, patient will need dental follow up as this is the second time she has presented to the hospital for sequelae of dental infection.  Patient does  not currently have dental insurance.  Information for [facility] (adult dental practice with sliding scale for self-pay patients) in [place], [place] was given if she is unable to find a dentist locally.   Discharge Vitals:   BP 107/59 mmHg  Pulse 64  Temp(Src) 98.5 F (36.9 C) (Oral)  Resp 18  Ht 5\' 3"  (1.6 m)  Wt 132 lb 15 oz (60.3 kg)  BMI 23.55 kg/m2  SpO2 100%  LMP 06/11/2015  Discharge Labs:  No results found for this or any previous visit (from the past 24 hour(s)).  Signed: Beather Arbour, MD 07/01/2015, 8:48 AM    Services Ordered on Discharge: None Equipment Ordered on Discharge: None

## 2015-06-29 NOTE — Progress Notes (Signed)
Subjective: This morning, she was requesting that her diet be advanced. She feels improved as compared to yesterday and tolerated the procedure well. No difficulty swallowing or associated pain.   Objective: Vital signs in last 24 hours: Filed Vitals:   06/28/15 1653 06/28/15 2117 06/29/15 0418 06/29/15 0427  BP: 111/67 103/61 109/73   Pulse: 95 65 68   Temp: 99.3 F (37.4 C) 98.3 F (36.8 C) 98.2 F (36.8 C)   TempSrc: Oral Oral Oral   Resp: Height:      Weight:    135 lb 8 oz (61.462 kg)  SpO2: 98% 98% 97%    Weight change: 0 lb (0 kg)  Intake/Output Summary (Last 24 hours) at 06/29/15 5784 Last data filed at 06/28/15 1600  Gross per 24 hour  Intake   1000 ml  Output      5 ml  Net    995 ml   General: young African American woman, sitting up in bed, no respiratory distress HEENT: EOMI, no scleral icterus, bandage overlying R submandibular area that's clean/dry/intact, able to open mouth to visualize posterior oropharynx without pain or difficulty Cardiac: RRR, no rubs, murmurs or gallops Pulm: clear to auscultation bilaterally, no wheezes, rales, or rhonchi Abd: soft, nontender, nondistended, BS present Ext: warm and well perfused, no pedal edema Neuro: responds to questions appropriately; moving all extremities freely  Lab Results: Basic Metabolic Panel:  Recent Labs Lab 06/28/15 0530 06/29/15 0520  NA 138 137  K 3.7 4.2  CL 104 104  CO2 22 23  GLUCOSE 87 135*  BUN 6 7  CREATININE 0.98 0.90  CALCIUM 8.9 9.1   CBC:  Recent Labs Lab 06/28/15 0530 06/29/15 0520  WBC 15.1* 17.7*  NEUTROABS 11.8* 16.4*  HGB 12.5 12.2  HCT 38.7 36.6  MCV 89.2 87.4  PLT 176 159   Micro Results: Recent Results (from the past 240 hour(s))  Culture, blood (routine x 2)     Status: None (Preliminary result)   Collection Time: 06/27/15  3:15 PM  Result Value Ref Range Status   Specimen Description BLOOD RIGHT ANTECUBITAL  Final   Special Requests BOTTLES  DRAWN AEROBIC AND ANAEROBIC 5CC  Final   Culture NO GROWTH < 24 HOURS  Final   Report Status PENDING  Incomplete  Culture, blood (routine x 2)     Status: None (Preliminary result)   Collection Time: 06/27/15  3:20 PM  Result Value Ref Range Status   Specimen Description BLOOD LEFT ANTECUBITAL  Final   Special Requests BOTTLES DRAWN AEROBIC AND ANAEROBIC 5CC  Final   Culture NO GROWTH < 24 HOURS  Final   Report Status PENDING  Incomplete  Surgical pcr screen     Status: None   Collection Time: 06/28/15  1:00 PM  Result Value Ref Range Status   MRSA, PCR NEGATIVE NEGATIVE Final   Staphylococcus aureus NEGATIVE NEGATIVE Final    Comment:        The Xpert SA Assay (FDA approved for NASAL specimens in patients over 52 years of age), is one component of a comprehensive surveillance program.  Test performance has been validated by [facility] for patients greater than or equal to 28 year old. It is not intended to diagnose infection nor to guide or monitor treatment.    Studies/Results: Ct Soft Tissue Neck W Contrast  06/28/2015  ADDENDUM REPORT: 06/28/2015 12:24 ADDENDUM: These results were called by telephone at the time of interpretation  on 06/28/2015 at 12:23 pm to Dr. Annalee Genta , who verbally acknowledged these results. Electronically Signed   By: Marlan Palau M.D.   On: 06/28/2015 12:24  06/28/2015  CLINICAL DATA:  Submandibular abscess.  Worsening pain EXAM: CT NECK WITH CONTRAST TECHNIQUE: Multidetector CT imaging of the neck was performed using the standard protocol following the bolus administration of intravenous contrast. CONTRAST:  75mL OMNIPAQUE IOHEXOL 300 MG/ML  SOLN COMPARISON:  CT neck 06/27/2015 FINDINGS: Pharynx and larynx: Progressive soft tissue swelling in the floor of the mouth on the right. Developing abscess now identified which has enlarged in the interval. Rim enhancing fluid collection in the submandibular and sublingual space has enlarged significantly in the  interval now measuring approximately 15 by 30 mm. This is located anterior and inferior to the sublingual gland in the floor of the mouth. Increased edema in the subcutaneous tissues below the chin. Increase in edema inferior to the submandibular gland bilaterally. Dental caries involving the third molars bilaterally upper and lower. This is most severe right lower third molar. No bony destruction. No airway compromise.  Larynx intact. Salivary glands: Parotid gland normal bilaterally. Submandibular gland shows normal size and density without mass or abscess or stone. Fluid around the submandibular gland is felt to be reactive from the infection in the floor of the mouth on the right. Thyroid: Normal thyroid. Increased subcutaneous edema extending into the anterior neck compared with yesterday. Lymph nodes: Enlarged lymph nodes in the neck bilaterally enhance homogeneously and are consistent with reactive adenopathy from infection. Mild progression. Vascular: Jugular vein patent bilaterally. Carotid artery patent bilaterally. Limited intracranial: Negative Visualized orbits: Negative Mastoids and visualized paranasal sinuses: Mild mucosal edema in the left maxillary sinus. Remaining sinuses clear. Skeleton: No fracture.  No bony destruction. Upper chest: Lung apices clear. No abscess in the superior mediastinum. 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 Abscess in the right floor of the mouth now measures approximately 15 x 20 mm. Increase soft tissue edema involving the chin, submandibular region, and anterior neck. No airway compromise at this time. Electronically Signed: By: Marlan Palau M.D. On: 06/28/2015 12:07   Ct Soft Tissue Neck W Contrast  06/27/2015  CLINICAL DATA:  Right lower jaw pain and swelling yesterday which is worsening. Difficulty swallowing. Evaluation for deep space infection. EXAM: CT NECK WITH CONTRAST TECHNIQUE:  Multidetector CT imaging of the neck was performed using the standard protocol following the bolus administration of intravenous contrast. CONTRAST:  75mL OMNIPAQUE IOHEXOL 300 MG/ML  SOLN COMPARISON:  None. FINDINGS: Pharynx and larynx: Multiple dental caries are noted with most prominent involvement of the maxillary greater than mandibular molars bilaterally. Mild periapical lucencies are noted involving the left maxillary third molar and right mandibular central incisor. Moderate inflammatory change is seen in the right sublingual and submandibular spaces. Fluid tracking in the right sublingual space extends over a length of approximately 3.5 cm and measures approximately 6 mm in thickness with partial rim enhancement. No well-defined, space-occupying fluid collection/abscess is identified. There is thickening of the right platysma. Inflammatory changes extend into the right parapharyngeal space. No pharyngeal mucosal lesion is identified. The larynx is unremarkable. Salivary glands: Submandibular and parotid glands are unremarkable. Thyroid: Unremarkable. Lymph nodes: Enlarged level I and II lymph nodes bilaterally are likely reactive and measure up to 1 cm in short axis. Vascular: Major vascular structures of the neck appear patent. Limited intracranial: The visualized portion of the brain  is unremarkable. Visualized orbits: Unremarkable. Mastoids and visualized paranasal sinuses: Mild mucosal thickening in the left maxillary sinus. Hypoplastic left frontal sinus. Clear mastoid air cells. Pre dissection Skeleton: No acute osseous abnormality identified. Upper chest: Clear lung apices. IMPRESSION: 1. Moderate inflammatory changes consistent with infection in the right sublingual, submandibular, and parapharyngeal spaces. Likely developing, small right sublingual space abscess. 2. Reactive upper cervical lymphadenopathy. Electronically Signed   By: Sebastian Ache M.D.   On: 06/27/2015 14:20   Medications: I have  reviewed the patient's current medications. Scheduled Meds: . ampicillin-sulbactam (UNASYN) IV  3 g Intravenous Q6H  . sodium chloride flush  3 mL Intravenous Q12H   Continuous Infusions: . lactated ringers 10 mL/hr at 06/28/15 1448   PRN Meds:.acetaminophen **OR** acetaminophen, HYDROmorphone (DILAUDID) injection Assessment/Plan:  Ms. Denise Banks is a 30 year old female hospitalized for Ludwig's angina 2/2 dental caries now s/p I&D.  Ludwig's angina s/p I&D: As noted on repeat CT imaging yesterday and underwent surgical management yesterday by ENT. No fevers overnight.  -Continue Unasyn IV with plan to transition to Augmentin on discharge -Follow-up cultures -Advance diet as tolerated -Convert Dilaudid to Percocet 5/325 x 1-2 tablets every 4 hours as needed -ENT following, appreciate recommendations  Dental caries: CT imaging notable for 3rd molar involvement bilaterally [upper and lower]. -Follow-up with dentist following on discharge  Dispo: Disposition is deferred at this time, awaiting improvement of current medical problems.  Anticipated discharge in approximately 1-2 day(s).   The patient does not know have a current PCP (No primary care provider on file.) and does not know need an [facility] hospital follow-up appointment after discharge.  The patient does not know have transportation limitations that hinder transportation to clinic appointments.  .Services Needed at time of discharge: Y = Yes, Blank = No PT:   OT:   RN:   Equipment:   Other:     LOS: 1 day   Beather Arbour, MD 06/29/2015, 7:28 AM

## 2015-06-29 NOTE — Progress Notes (Signed)
ENT Progress Note: POD #1 s/p Procedure(s): INCISION AND DRAINAGE OF NECK/FLOOR OF MOUTH ABSCESS   Subjective: Sig improvement in pain and swelling  Objective: Vital signs in last 24 hours: Temp:  [98.2 F (36.8 C)-99.3 F (37.4 C)] 98.2 F (36.8 C) (02/19 0418) Pulse Rate:  [63-113] 68 (02/19 0418) Resp:  [16-24] 16 (02/19 0418) BP: (103-120)/(61-91) 109/73 mmHg (02/19 0418) SpO2:  [97 %-100 %] 97 % (02/19 0418) Weight:  [61.462 kg (135 lb 8 oz)] 61.462 kg (135 lb 8 oz) (02/19 0427) Weight change: 0 kg (0 lb) Last BM Date: 06/27/15  Intake/Output from previous day: 02/18 0701 - 02/19 0700 In: 1650 [I.V.:1150; IV Piggyback:500] Out: 5 [Blood:5] Intake/Output this shift:    Labs:  Recent Labs  06/28/15 0530 06/29/15 0520  WBC 15.1* 17.7*  HGB 12.5 12.2  HCT 38.7 36.6  PLT 176 159    Recent Labs  06/28/15 0530 06/29/15 0520  NA 138 137  K 3.7 4.2  CL 104 104  CO2 22 23  GLUCOSE 87 135*  BUN 6 7  CALCIUM 8.9 9.1    Studies/Results: Ct Soft Tissue Neck W Contrast  06/28/2015  ADDENDUM REPORT: 06/28/2015 12:24 ADDENDUM: These results were called by telephone at the time of interpretation on 06/28/2015 at 12:23 pm to Dr. Annalee Genta , who verbally acknowledged these results. Electronically Signed   By: Marlan Palau M.D.   On: 06/28/2015 12:24  06/28/2015  CLINICAL DATA:  Submandibular abscess.  Worsening pain EXAM: CT NECK WITH CONTRAST TECHNIQUE: Multidetector CT imaging of the neck was performed using the standard protocol following the bolus administration of intravenous contrast. CONTRAST:  75mL OMNIPAQUE IOHEXOL 300 MG/ML  SOLN COMPARISON:  CT neck 06/27/2015 FINDINGS: Pharynx and larynx: Progressive soft tissue swelling in the floor of the mouth on the right. Developing abscess now identified which has enlarged in the interval. Rim enhancing fluid collection in the submandibular and sublingual space has enlarged significantly in the interval now measuring  approximately 15 by 30 mm. This is located anterior and inferior to the sublingual gland in the floor of the mouth. Increased edema in the subcutaneous tissues below the chin. Increase in edema inferior to the submandibular gland bilaterally. Dental caries involving the third molars bilaterally upper and lower. This is most severe right lower third molar. No bony destruction. No airway compromise.  Larynx intact. Salivary glands: Parotid gland normal bilaterally. Submandibular gland shows normal size and density without mass or abscess or stone. Fluid around the submandibular gland is felt to be reactive from the infection in the floor of the mouth on the right. Thyroid: Normal thyroid. Increased subcutaneous edema extending into the anterior neck compared with yesterday. Lymph nodes: Enlarged lymph nodes in the neck bilaterally enhance homogeneously and are consistent with reactive adenopathy from infection. Mild progression. Vascular: Jugular vein patent bilaterally. Carotid artery patent bilaterally. Limited intracranial: Negative Visualized orbits: Negative Mastoids and visualized paranasal sinuses: Mild mucosal edema in the left maxillary sinus. Remaining sinuses clear. Skeleton: No fracture.  No bony destruction. Upper chest: Lung apices clear. No abscess in the superior mediastinum. 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 Abscess in the right floor of the mouth now measures approximately 15 x 20 mm. Increase soft tissue edema involving the chin, submandibular region, and anterior neck. No airway compromise at this time. Electronically Signed: By: Marlan Palau M.D. On: 06/28/2015 12:07   Ct Soft Tissue Neck W  Contrast  06/27/2015  CLINICAL DATA:  Right lower jaw pain and swelling yesterday which is worsening. Difficulty swallowing. Evaluation for deep space infection. EXAM: CT NECK WITH CONTRAST TECHNIQUE: Multidetector CT imaging  of the neck was performed using the standard protocol following the bolus administration of intravenous contrast. CONTRAST:  75mL OMNIPAQUE IOHEXOL 300 MG/ML  SOLN COMPARISON:  None. FINDINGS: Pharynx and larynx: Multiple dental caries are noted with most prominent involvement of the maxillary greater than mandibular molars bilaterally. Mild periapical lucencies are noted involving the left maxillary third molar and right mandibular central incisor. Moderate inflammatory change is seen in the right sublingual and submandibular spaces. Fluid tracking in the right sublingual space extends over a length of approximately 3.5 cm and measures approximately 6 mm in thickness with partial rim enhancement. No well-defined, space-occupying fluid collection/abscess is identified. There is thickening of the right platysma. Inflammatory changes extend into the right parapharyngeal space. No pharyngeal mucosal lesion is identified. The larynx is unremarkable. Salivary glands: Submandibular and parotid glands are unremarkable. Thyroid: Unremarkable. Lymph nodes: Enlarged level I and II lymph nodes bilaterally are likely reactive and measure up to 1 cm in short axis. Vascular: Major vascular structures of the neck appear patent. Limited intracranial: The visualized portion of the brain is unremarkable. Visualized orbits: Unremarkable. Mastoids and visualized paranasal sinuses: Mild mucosal thickening in the left maxillary sinus. Hypoplastic left frontal sinus. Clear mastoid air cells. Pre dissection Skeleton: No acute osseous abnormality identified. Upper chest: Clear lung apices. IMPRESSION: 1. Moderate inflammatory changes consistent with infection in the right sublingual, submandibular, and parapharyngeal spaces. Likely developing, small right sublingual space abscess. 2. Reactive upper cervical lymphadenopathy. Electronically Signed   By: Sebastian Ache M.D.   On: 06/27/2015 14:20     PHYSICAL EXAM: JP in place, min dc Sig  decrease in swelling and erythema, no trismus   Assessment/Plan: Clinically improving after I&D and abx rec additional day of IV abx Plan drain removal and d/c 2/20 Pt will need OP dental/OS eval.    Kenn Rekowski 06/29/2015, 9:52 AM

## 2015-06-30 ENCOUNTER — Encounter (HOSPITAL_COMMUNITY): Payer: Self-pay | Admitting: Otolaryngology

## 2015-06-30 ENCOUNTER — Telehealth: Payer: Self-pay | Admitting: Internal Medicine

## 2015-06-30 MED ORDER — OXYCODONE-ACETAMINOPHEN 5-325 MG PO TABS: 1.0000 | ORAL_TABLET | Freq: Four times a day (QID) | ORAL | Status: DC | PRN

## 2015-06-30 MED ORDER — OXYCODONE-ACETAMINOPHEN 5-325 MG PO TABS: 1.0000 | ORAL_TABLET | Freq: Four times a day (QID) | ORAL | Status: AC | PRN

## 2015-06-30 MED ORDER — AMOXICILLIN-POT CLAVULANATE 875-125 MG PO TABS: 1.0000 | ORAL_TABLET | Freq: Two times a day (BID) | ORAL | Status: AC

## 2015-06-30 NOTE — Progress Notes (Signed)
  Subjective: Patient reports that she is feeling well this morning.  She has been able to eat, drink, and tolerate PO medicine.    Objective: Vital signs in last 24 hours: Filed Vitals:   06/29/15 1548 06/29/15 2115 06/30/15 0500 06/30/15 0616  BP: 122/67 123/89  107/59  Pulse: 67 69  64  Temp: 98.2 F (36.8 C) 98.7 F (37.1 C)  98.5 F (36.9 C)  TempSrc: Oral Oral  Oral  Resp: Height:      Weight:   61.2 kg (134 lb 14.7 oz) 60.3 kg (132 lb 15 oz)  SpO2: 100% 100%  100%   General Appearance: Young woman, resting comfortably in hospital bed with dressing applied to neck, alert, cooperative, no distress, appears stated age HEENT: Decreased swelling, bandage applied to recent surgical site  Lungs: Clear to auscultation bilaterally, respirations unlabored Heart: Regular rate and rhythm, S1 and S2 normal, no murmur, rub   or gallop Abdomen: Soft, non-tender, bowel sounds active all four quadrants,no masses, no organomegaly Extremities: Extremities normal, atraumatic, no cyanosis or edema  Lab Results: Blood Cx: NGTD x 2 days Abscess cultures: pending   Medications: Scheduled Meds: . ampicillin-sulbactam (UNASYN) IV  3 g Intravenous Q6H  . Influenza vac split quadrivalent PF  0.5 mL Intramuscular Tomorrow-1000  . pneumococcal 23 valent vaccine  0.5 mL Intramuscular Tomorrow-1000  . sodium chloride flush  3 mL Intravenous Q12H   Continuous Infusions: . lactated ringers 10 mL/hr at 06/28/15 1448   PRN Meds:.acetaminophen **OR** acetaminophen, oxyCODONE-acetaminophen Assessment/Plan:  Ms. Vokes is a 30 year old woman with past medical history notable for blood transfusion at age 81 for heavy menstrual bleeding who presents with complaint of painful right sided neck swelling and odynophagia for 1 day who is found to have elevated WBC of 17.3, elevated serum Cr, and moderate inflammatory changes consistent with infection in the R sublingual, submandibular and  parapharyngeal spaces and reactive cervical lymphadenopathy on CT.  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  ENT was at patient bedside to remove drain today.  Patient will need dental follow up for tooth extraction.  - Change from Unasyn 3 g IV Q8H to Augmentin 875-125 mg PO BID (last dose 07/11/15) - Decrease frequency of percocet 5-325 mg PO from Q4H to Q8H  - Follow up blood cultures and abscess cultures - Will need dental follow up  Dental Caries: CT imaging showed 3rd molar caries - Follow up with dentist following discharge - Gave information for dentist in [place] that provides care for adults in the nearby counties at a sliding scale price for self pay patients   This is a Psychologist, occupational Note.  The care of the patient was discussed with Dr. Evelena Peat and the assessment and plan formulated with their assistance.  Please see their attached note for official documentation of the daily encounter.   LOS: 2 days   Lacie Scotts, Med Student 06/30/2015, 8:52 AM

## 2015-06-30 NOTE — Telephone Encounter (Signed)
Pt needs TOC pt appt 07/08/15 at 3:15 with dr Mikey Bussing

## 2015-06-30 NOTE — Progress Notes (Addendum)
   Subjective: Denise Banks was seen and examined this AM.  She is feeling well, denies dyspnea or difficulty swallowing. Pain is well controlled.  Her appetite is good.  She plans to follow-up with dentist or oral surgeon soon after admission.  Objective: Vital signs in last 24 hours: Filed Vitals:   06/29/15 1548 06/29/15 2115 06/30/15 0500 06/30/15 0616  BP: 122/67 123/89  107/59  Pulse: 67 69  64  Temp: 98.2 F (36.8 C) 98.7 F (37.1 C)  98.5 F (36.9 C)  TempSrc: Oral Oral  Oral  Resp: Height:      Weight:   134 lb 14.7 oz (61.2 kg) 132 lb 15 oz (60.3 kg)  SpO2: 100% 100%  100%   Weight change: -9.3 oz (-0.263 kg)  Intake/Output Summary (Last 24 hours) at 06/30/15 1008 Last data filed at 06/29/15 1900  Gross per 24 hour  Intake    320 ml  Output      0 ml  Net    320 ml   General: sitting up in bed in NAD HEENT: [place]/AT, oropharynx clear, she is able to open mouth and extend tongue without pain or difficulty, right submandibular I&D bandage is dry and intact with small amount of serosanguinous drainage, the incision site is intact without erythema. Cardiac: RRR, no rubs, murmurs or gallops Pulm: clear to auscultation bilaterally, no stridor or respiratory distress, moving normal volumes of air Abd: soft, nontender, nondistended, BS present Ext: warm and well perfused, no pedal edema Neuro: alert and oriented X3, responding appropriately, moving extremities spontaneously  Medications: I have reviewed the patient's current medications. Scheduled Meds: . ampicillin-sulbactam (UNASYN) IV  3 g Intravenous Q6H  . Influenza vac split quadrivalent PF  0.5 mL Intramuscular Tomorrow-1000  . pneumococcal 23 valent vaccine  0.5 mL Intramuscular Tomorrow-1000  . sodium chloride flush  3 mL Intravenous Q12H   Continuous Infusions: . lactated ringers 10 mL/hr at 06/28/15 1448   PRN Meds:.acetaminophen **OR** acetaminophen, oxyCODONE-acetaminophen   Assessment/Plan: 30  year old previously healthy woman here with right neck swelling and odynophagia since yesterday.  Right submandibular abscess: likely spread from dental caries.  On IV Unasyn and POD #2 s/p I&D.  She is feeling well, able to swallow, handling secretions.  We appreciate ENT's intervention and recommendations. - d/c home on Augmentin BID x two weeks total - follow-up in [facility] next week - follow-up with dentis or oral surgeon for definitive treatment of dental caries  Elevated creatinine: Resolved, Cr. 1.08 --> 0.90.  Likely pre-renal due to decrease po in past day due to odynophagia.  Dispo: She is medically stable for discharge home with close follow-up in [facility] and with dentist and/or oral surgeon.  The patient does not have a current PCP (No primary care provider on file.) and does need an [facility] hospital follow-up appointment after discharge.  The patient does not have transportation limitations that hinder transportation to clinic appointments.  .Services Needed at time of discharge: Y = Yes, Blank = No PT:   OT:   RN:   Equipment:   Other:     LOS: 2 days   Yolanda Manges, DO 06/30/2015, 10:08 AM

## 2015-06-30 NOTE — Progress Notes (Signed)
Pharmacist Provided - Patient Medication Education Prior to Discharge   Denise Banks is an 30 y.o. female who presented to [facility] on 06/27/2015 with a chief complaint of  Chief Complaint  Patient presents with  . Facial Swelling      Patient will be discharged with new medications  Patient being discharged without any new medications  The following medications were discussed with the patient:  Pain Control medications:  Yes     No  Diabetes Medications:  Yes     No  Heart Failure Medications:  Yes     No  Anticoagulation Medications:   Yes     No  Antibiotics at discharge:  Yes     No  Allergy Assessment Completed and Updated:  Yes     No Identified Patient Allergies: No Known Allergies   Medication Adherence Assessment:  Excellent (no doses missed/week)       Good (1 dose missed/week)       Partial (2-3 doses missed/week)       Poor (>3 doses missed/week)  Barriers to Obtaining Medications:  Yes  No  Assessment: 30 y/o F with abscess s/p drainage. Will be sent home on short course of antibiotics. Provided pt counseling on abx on proper usage and side effects. Pt verbalized understanding of treatment plan.   Time spent preparing for discharge counseling: 15 min Time spent counseling patient: 20 min  [place] Pink, PharmD 06/30/2015, 1:55 PM

## 2015-06-30 NOTE — Telephone Encounter (Signed)
Transition Care Management Follow-up Telephone Call   Date discharged?06/30/2015   How have you been since you were released from the hospital?    Do you understand why you were in the hospital?    Do you understand the discharge instructions?    Where were you discharged to?    Items Reviewed:  Medications reviewed:   Allergies reviewed:   Dietary changes reviewed:   Referrals reviewed:    Functional Questionnaire:   Activities of Daily Living (ADLs):   She states they are independent in the following: unable to determine States they require assistance with the following: unable to determine   Any transportation issues/concerns?:    Any patient concerns?    Confirmed importance and date/time of follow-up visits scheduled   Provider Appointment booked with  Confirmed with patient if condition begins to worsen call PCP or go to the ER.  Patient was given the office number and encouraged to call back with question or concerns.    07/01/2015- no answer, lm for rtc 06/30/2015- lm for rtc to triage 06/30/2015- pt called back but is still in the hospital, will speak w/ her possibly tomorrow 2/28 cancelled appt, lm for rtc

## 2015-06-30 NOTE — Progress Notes (Signed)
NURSING PROGRESS NOTE  [facility] [id number] Discharge Data: 06/30/2015 10:04 AM Attending Provider: Tyson Alias, MD PCP:No primary care provider on file.     [facility] to be D/C'd Home per MD order.  Discussed with the patient the After Visit Summary and all questions fully answered. All IV's discontinued with no bleeding noted. All belongings returned to patient for patient to take home.   Last Vital Signs:  Blood pressure 107/59, pulse 64, temperature 98.5 F (36.9 C), temperature source Oral, resp. rate 18, height  (1.6 m), weight 60.3 kg (132 lb 15 oz), last menstrual period 06/11/2015, SpO2 100 %.  Discharge Medication List   Medication List    STOP taking these medications        HYDROcodone-acetaminophen 5-325 MG tablet  Commonly known as:  NORCO/VICODIN      TAKE these medications        amoxicillin-clavulanate 875-125 MG tablet  Commonly known as:  AUGMENTIN  Take 1 tablet by mouth 2 (two) times daily.     naproxen 500 MG tablet  Commonly known as:  NAPROSYN  Take 1 tablet (500 mg total) by mouth 2 (two) times daily.     oxyCODONE-acetaminophen 5-325 MG tablet  Commonly known as:  PERCOCET/ROXICET  Take 1 tablet by mouth every 6 (six) hours as needed for moderate pain.         Bennie Pierini, RN

## 2015-06-30 NOTE — Progress Notes (Signed)
CM spoke with about dental f/u. Pt stated she will f/u with [facility] which she has used in the past. Pt will f/u with [facility] clinic for f/u hospital visit and to establish PCP. CM encouraged pt to apply for orange card @ f/u visit. Pt without insurance , however, starting new job and will have insurance in 90 days. Gae Gallop RN,BSN,CM [phone]

## 2015-06-30 NOTE — Discharge Instructions (Signed)
You were admitted to [facility] for an infection that started in your teeth and spread to your neck called Ludwig's Angina.  You were started on an antibiotic called Unasyn that was given to you through an IV but at home you should take Augmentin by mouth two times daily until 07/11/15.  Even if you start to feel better, continue taking your antibiotic two times a day until it runs out.  Your pain should continue to improve.  You can take Percoet for severe pain (but don't take with Tylenol because it already has Tylenol it it).   You can also try NSAID medication, like ibuprofen as needed.  Dental follow up is very important in keeping you healthy.  If you are unable to find a dental clinic locally, you may want to seek care at [facility] in [place], [place]  The following information is from their website:  The [facility] is part of the [facility] Department of [employer]. We serve children and adults of [facility] and surrounding areas. We accept Medicaid, [place] [employer], [employer] and self-pay patients. A sliding fee scale may be applied for self-pay patients.  We are a fully functioning dental clinic for both children and adults. Our caring, compassionate staff will help ensure that you have a pleasant dental experience and will give you the personalized attention you need to make great choices about your dental health.  HOURS  Monday : 7:30 am - 5:00 pm Tuesday: 7:30 am - 5:00 pm Wednesday: 7:30 am - 5:00 pm Thursday: 7:30 am - 5:00 pm Friday: 7:30 am - 11:30 am  [address], [address] [place], [place] [address] [phone]  ____________________________________ Dental Care and Dentist Visits Dental care supports good overall health. Regular dental visits can also help you avoid dental pain, bleeding, infection, and other more serious health problems in the future. It is important to keep the mouth healthy because diseases  in the teeth, gums, and other oral tissues can spread to other areas of the body. Some problems, such as diabetes, heart disease, and pre-term labor have been associated with poor oral health.  See your dentist every 6 months. If you experience emergency problems such as a toothache or broken tooth, go to the dentist right away. If you see your dentist regularly, you may catch problems early. It is easier to be treated for problems in the early stages.  WHAT TO EXPECT AT A DENTIST VISIT  Your dentist will look for many common oral health problems and recommend proper treatment. At your regular dental visit, you can expect:  Gentle cleaning of the teeth and gums. This includes scraping and polishing. This helps to remove the sticky substance around the teeth and gums (plaque). Plaque forms in the mouth shortly after eating. Over time, plaque hardens on the teeth as tartar. If tartar is not removed regularly, it can cause problems. Cleaning also helps remove stains.  Periodic X-rays. These pictures of the teeth and supporting bone will help your dentist assess the health of your teeth.  Periodic fluoride treatments. Fluoride is a natural mineral shown to help strengthen teeth. Fluoride treatmentinvolves applying a fluoride gel or varnish to the teeth. It is most commonly done in children.  Examination of the mouth, tongue, jaws, teeth, and gums to look for any oral health problems, such as:  Cavities (dental caries). This is decay on the tooth caused by plaque, sugar, and acid in the mouth.  It is best to catch a cavity when it is small.  Inflammation of the gums caused by plaque buildup (gingivitis).  Problems with the mouth or malformed or misaligned teeth.  Oral cancer or other diseases of the soft tissues or jaws. KEEP YOUR TEETH AND GUMS HEALTHY For healthy teeth and gums, follow these general guidelines as well as your dentist's specific advice:  Have your teeth professionally cleaned at  the dentist every 6 months.  Brush twice daily with a fluoride toothpaste.  Floss your teeth daily.  Ask your dentist if you need fluoride supplements, treatments, or fluoride toothpaste.  Eat a healthy diet. Reduce foods and drinks with added sugar.  Avoid smoking. TREATMENT FOR ORAL HEALTH PROBLEMS If you have oral health problems, treatment varies depending on the conditions present in your teeth and gums.  Your caregiver will most likely recommend good oral hygiene at each visit.  For cavities, gingivitis, or other oral health disease, your caregiver will perform a procedure to treat the problem. This is typically done at a separate appointment. Sometimes your caregiver will refer you to another dental specialist for specific tooth problems or for surgery. SEEK IMMEDIATE DENTAL CARE IF:  You have pain, bleeding, or soreness in the gum, tooth, jaw, or mouth area.  A permanent tooth becomes loose or separated from the gum socket.  You experience a blow or injury to the mouth or jaw area.   This information is not intended to replace advice given to you by your health care provider. Make sure you discuss any questions you have with your health care provider.   Document Released: 01/06/2011 Document Revised: 07/19/2011 Document Reviewed: 01/06/2011 Elsevier Interactive Patient Education [employer]

## 2015-07-01 LAB — CULTURE, ROUTINE-ABSCESS

## 2015-07-02 LAB — CULTURE, BLOOD (ROUTINE X 2)
CULTURE: NO GROWTH
Culture: NO GROWTH

## 2015-07-02 NOTE — Telephone Encounter (Signed)
Transition Care Management Follow-up Telephone Call   Date discharged? 2/20   How have you been since you were released from the hospital? OK but work causing mouth to throb, thinks being out to heal is good idea    Do you understand why you were in the hospital? yes   Do you understand the discharge instructions? yes   Where were you discharged to? home   Items Reviewed:  Medications reviewed: yes  Allergies reviewed: yes  Dietary changes reviewed: na  Referrals reviewed: yes, she has been in contact trying to obtain dental appointment    Functional Questionnaire:   Activities of Daily Living (ADLs):   She states they are independent in the following: ambulation, bathing and hygiene, feeding, continence, grooming, toileting and dressing States they require assistance with the following: na   Any transportation issues/concerns?: no   Any patient concerns? 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  Patient was given the office number and encouraged to call back with question or concerns.  : yes

## 2015-07-02 NOTE — Telephone Encounter (Signed)
3rd attempt at [facility] call Reminded of 3/3 appointment

## 2015-07-03 ENCOUNTER — Encounter: Payer: Self-pay | Admitting: Internal Medicine

## 2015-07-03 ENCOUNTER — Ambulatory Visit (INDEPENDENT_AMBULATORY_CARE_PROVIDER_SITE_OTHER): Payer: Self-pay | Admitting: Internal Medicine

## 2015-07-03 VITALS — BP 113/71 | HR 62 | Temp 98.1°F | Ht 63.0 in | Wt 136.7 lb

## 2015-07-03 DIAGNOSIS — K05219 Aggressive periodontitis, localized, unspecified severity: Secondary | ICD-10-CM

## 2015-07-03 DIAGNOSIS — K122 Cellulitis and abscess of mouth: Secondary | ICD-10-CM

## 2015-07-03 NOTE — Progress Notes (Signed)
Medicine attending: Medical history, presenting problems, physical findings, and medications, reviewed with resident physician Dr William Kennedy on the day of the patient visit and I concur with his evaluation and management plan. 

## 2015-07-03 NOTE — Patient Instructions (Signed)
I'm happy you're starting to feel better. Please make an appointment in 2-3 months to make a new patient visit.

## 2015-07-04 LAB — ANAEROBIC CULTURE

## 2015-07-05 NOTE — Progress Notes (Signed)
   Patient ID: Denise Banks female   DOB: 17-Aug-1985 30 y.o.   MRN: [id number]  Subjective:   HPI: Ms.Denise Banks is a 30 y.o. with no PMH beside recent hospitalization for Ludwig's angina who presents to [facility] today for hospital follow-up. She says her pain and swelling have continued to decrease. She still has intermittent clear drainage from the incision, but otherwise has no issues. 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 The percocet does control the pain very well but she gets 'loopy' during work and only takes tylenol during working hours, which only works modestly. She denies worsening pain, fevers, erythema, trouble swallowing, shortness of breath, chest pain, or other complaints.    Please see problem-based charting for status of medical issues pertinent to this visit.  Review of Systems: Pertinent items noted in HPI and remainder of comprehensive ROS otherwise negative.  Objective:  Physical Exam: Filed Vitals:   07/03/15 1545  BP: 113/71  Pulse: 62  Temp: 98.1 F (36.7 C)  TempSrc: Oral  Height:  (1.6 m)  Weight: 136 lb 11.2 oz (62.007 kg)  SpO2: 100%   Gen: Well-appearing, alert and oriented to person, place, and time HEENT: Oropharynx clear without erythema or exudate. Oral cavity without apparent drainage, swelling, or abscess. Bandage c/d/i over very mild R submandibular swelling. No erythema noted. No drainage expressed. Neck: No cervical LAD, no thyromegaly or nodules, no JVD noted. CV: Normal rate, regular rhythm, no murmurs, rubs, or gallops Pulmonary: Normal effort, CTA bilaterally, no wheezing, rales, or rhonchi Abdominal: Soft, non-tender, non-distended, without rebound, guarding, or masses Extremities: Distal pulses 2+ in upper and lower extremities bilaterally, no tenderness, erythema or edema Skin: No atypical appearing moles. No rashes  Assessment & Plan:  Please see problem-based charting for  assessment and plan.  Reubin Milan, MD Resident Physician, PGY-1 Department of Internal Medicine [facility]

## 2015-07-05 NOTE — Assessment & Plan Note (Signed)
Much improved from hospitalization. Not exhibiting any s/s systemic infection. Pt has contacted local dental services and is supposed to set up an appointment soon. 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 -Continue abx, analgesics as prescribed -F/u with dentistry -Will write work note today for another couple days of rest -F/u in 2-3 months for new patient/establish PCP in our clinic

## 2015-07-08 ENCOUNTER — Ambulatory Visit: Payer: Self-pay | Admitting: Internal Medicine

## 2015-07-11 ENCOUNTER — Ambulatory Visit: Payer: Self-pay | Admitting: Internal Medicine

## 2015-08-25 ENCOUNTER — Encounter: Payer: Self-pay | Admitting: Internal Medicine

## 2015-11-11 ENCOUNTER — Emergency Department (HOSPITAL_COMMUNITY)
Admission: EM | Admit: 2015-11-11 | Discharge: 2015-11-11 | Disposition: A | Discharge status: Home / Self Care | Payer: Self-pay | Attending: Emergency Medicine | Admitting: Emergency Medicine

## 2015-11-11 ENCOUNTER — Encounter (HOSPITAL_COMMUNITY): Payer: Self-pay | Admitting: Emergency Medicine

## 2015-11-11 DIAGNOSIS — F17210 Nicotine dependence, cigarettes, uncomplicated: Secondary | ICD-10-CM | POA: Insufficient documentation

## 2015-11-11 DIAGNOSIS — S025XXA Fracture of tooth (traumatic), initial encounter for closed fracture: Secondary | ICD-10-CM

## 2015-11-11 DIAGNOSIS — K047 Periapical abscess without sinus: Secondary | ICD-10-CM | POA: Insufficient documentation

## 2015-11-11 MED ORDER — HYDROCODONE-ACETAMINOPHEN 5-325 MG PO TABS: 1.0000 | ORAL_TABLET | Freq: Four times a day (QID) | ORAL | Status: AC | PRN

## 2015-11-11 MED ORDER — AMOXICILLIN 500 MG PO CAPS
500.0000 mg | ORAL_CAPSULE | Freq: Once | ORAL | Status: AC
  Administered 2015-11-11: 500 mg via ORAL
  Filled 2015-11-11: qty 1

## 2015-11-11 MED ORDER — AMOXICILLIN 500 MG PO CAPS: 500.0000 mg | ORAL_CAPSULE | Freq: Three times a day (TID) | ORAL | Status: AC

## 2015-11-11 MED ORDER — OXYCODONE-ACETAMINOPHEN 5-325 MG PO TABS
1.0000 | ORAL_TABLET | Freq: Once | ORAL | Status: AC
  Administered 2015-11-11: 1 via ORAL
  Filled 2015-11-11: qty 1

## 2015-11-11 NOTE — ED Provider Notes (Signed)
CSN: [id number]     Arrival date & time 11/11/15  2033 History  By signing my name below, I, Denise Banks, attest that this documentation has been prepared under the direction and in the presence of Kerrie BuffaloHope Neese, NP Electronically Signed: Soijett Banks, ED Scribe. 11/11/2015. 9:08 PM.   Chief Complaint  Patient presents with  . Dental Pain      Patient is a 30 y.o. female presenting with tooth pain. The history is provided by the patient. No language interpreter was used.  Dental Pain Location:  Lower Lower teeth location:  17/LL 3rd molar Quality:  Localized Severity:  Moderate Onset quality:  Sudden Duration:  2 days Timing:  Constant Progression:  Unchanged Chronicity:  Recurrent Context: dental fracture   Relieved by:  Nothing Worsened by:  Jaw movement Ineffective treatments:  Acetaminophen and NSAIDs (tylenol and ibuprofen) Associated symptoms: no fever and no trismus   Risk factors: smoking     Denise Banks is a 30 y.o. female who presents to the Emergency Department complaining of left lower dental pain onset 2 days. Pt reports that her wisdom tooth is fractured and she has had past episodes of right lower dental pain that resulted in an abscess to the area. Pt notes that she has an appointment with an oral surgeon, and it is for the end of July. She states that she is having associated symptoms of right ear pain. She states that she has tried extra strength tylenol and ibuprofen with no relief for her symptoms. She denies sore throat, fever, chills, and any other symptoms. Pt denies allergies to any medications.   History reviewed. No pertinent past medical history. Past Surgical History  Procedure Laterality Date  . Abscess drainage     History reviewed. No pertinent family history. Social History  Substance Use Topics  . Smoking status: Current Every Day Smoker -- 0.50 packs/day  . Smokeless tobacco: None  . Alcohol Use: No   OB History    No data available      Review of Systems  Constitutional: Negative for fever and chills.  HENT: Positive for dental problem (left lower) and ear pain (left). Negative for sore throat.   all other systems negative    Allergies  Review of patient's allergies indicates no known allergies.  Home Medications   Prior to Admission medications   Medication Sig Start Date End Date Taking? Authorizing Provider  amoxicillin (AMOXIL) 500 MG capsule Take 1 capsule (500 mg total) by mouth 3 (three) times daily. 11/11/15   Hope Orlene OchM Neese, NP  ferrous sulfate 325 (65 FE) MG EC tablet Take 325 mg by mouth daily.      Historical Provider, MD  HYDROcodone-acetaminophen (NORCO) 5-325 MG tablet Take 1 tablet by mouth every 6 (six) hours as needed. 11/11/15   Hope Orlene OchM Neese, NP  vitamin B-12 (CYANOCOBALAMIN) 1000 MCG tablet Take 1,000 mcg by mouth daily.      Historical Provider, MD   BP 134/92 mmHg  Pulse 75  Temp(Src) 98.2 F (36.8 C) (Oral)  Resp 20  Ht 5\' 3"  (1.6 m)  Wt 61.236 kg  BMI 23.92 kg/m2  SpO2 100%  LMP 11/03/2015 Physical Exam  Constitutional: She is oriented to person, place, and time. She appears well-developed and well-nourished. No distress.  HENT:  Head: Normocephalic and atraumatic.  Right Ear: Tympanic membrane normal.  Left Ear: Tympanic membrane normal.  Mouth/Throat: Uvula is midline, oropharynx is clear and moist and mucous membranes are normal. No trismus  in the jaw. Abnormal dentition.  Lower left third molar is broken and decayed to the gumline. Swelling of the gum surrounding the tooth. Tender on palpation.  Eyes: Conjunctivae and EOM are normal.  Neck: Neck supple.  Enlarged cervical lymph nodes on left.   Cardiovascular: Normal rate, regular rhythm and normal heart sounds.  Exam reveals no gallop and no friction rub.   No murmur heard. Pulmonary/Chest: Effort normal and breath sounds normal. No respiratory distress.  Abdominal: She exhibits no distension.  Musculoskeletal: Normal range of  motion.  Lymphadenopathy:    She has cervical adenopathy.  Neurological: She is alert and oriented to person, place, and time.  Skin: Skin is warm and dry.  Psychiatric: She has a normal mood and affect. Her behavior is normal.  Nursing note and vitals reviewed.   ED Course  Procedures (including critical care time) DIAGNOSTIC STUDIES: Oxygen Saturation is 100% on RA, nl by my interpretation.    COORDINATION OF CARE: 9:05 PM Discussed treatment plan with pt at bedside which includes amoxil and percocet and pt agreed to plan.    MDM   Final diagnoses:  Dental abscess  Broken tooth, closed, initial encounter   Patient with dentalgia.  No abscess requiring immediate incision and drainage.  Exam not concerning for Ludwig's angina or pharyngeal abscess.  Will discharge home with amoxil and Norco. Pt instructed to follow-up with oral surgeon as scheduled.  Discussed return precautions. Pt safe for discharge.  I personally performed the services described in this documentation, which was scribed in my presence. The recorded information has been reviewed and is accurate.    [address]Hope ElsberryM Neese, NP 11/12/15 0010  Leta BaptistEmily Roe Nguyen, MD 11/13/15 1700

## 2015-11-11 NOTE — ED Notes (Signed)
Patient Alert and oriented X4. Stable and ambulatory. Patient verbalized understanding of the discharge instructions.  Patient belongings were taken by the patient.  

## 2015-11-11 NOTE — Discharge Instructions (Signed)
Dental Abscess A dental abscess is pus in or around a tooth. HOME CARE  Take medicines only as told by your dentist.  If you were prescribed antibiotic medicine, finish all of it even if you start to feel better.  Rinse your mouth (gargle) often with salt water.  Do not drive or use heavy machinery, like a lawn mower, while taking pain medicine.  Do not apply heat to the outside of your mouth.  Keep all follow-up visits as told by your dentist. This is important. GET HELP IF:  Your pain is worse, and medicine does not help. GET HELP RIGHT AWAY IF:  You have a fever or chills.  Your symptoms suddenly get worse.  You have a very bad headache.  You have problems breathing or swallowing.  You have trouble opening your mouth.  You have puffiness (swelling) in your neck or around your eye.   This information is not intended to replace advice given to you by your health care provider. Make sure you discuss any questions you have with your health care provider.   Document Released: 09/10/2014 Document Reviewed: 09/10/2014 Elsevier Interactive Patient Education 2016 [employer] Inc[employer]  

## 2015-11-11 NOTE — ED Notes (Signed)
Pt presents to ED for assessment of lower left dental pain where her wisdom tooth is broken.  Pt sts pain started 2 days ago, but she developed a "lump" to her left neck area below the pain today.  Airway intact.

## 2015-12-18 ENCOUNTER — Encounter: Payer: Self-pay | Admitting: Internal Medicine

## 2016-08-03 ENCOUNTER — Encounter: Payer: Self-pay | Admitting: Internal Medicine

## 2016-10-14 ENCOUNTER — Encounter: Payer: Self-pay | Admitting: *Deleted

## 2017-03-12 ENCOUNTER — Emergency Department (HOSPITAL_COMMUNITY)
Admission: EM | Admit: 2017-03-12 | Discharge: 2017-03-12 | Disposition: A | Discharge status: Home / Self Care | Payer: PRIVATE HEALTH INSURANCE | Attending: Emergency Medicine | Admitting: Emergency Medicine

## 2017-03-12 ENCOUNTER — Encounter (HOSPITAL_COMMUNITY): Payer: Self-pay

## 2017-03-12 DIAGNOSIS — F17210 Nicotine dependence, cigarettes, uncomplicated: Secondary | ICD-10-CM | POA: Diagnosis not present

## 2017-03-12 DIAGNOSIS — L0291 Cutaneous abscess, unspecified: Secondary | ICD-10-CM

## 2017-03-12 DIAGNOSIS — Z79899 Other long term (current) drug therapy: Secondary | ICD-10-CM | POA: Insufficient documentation

## 2017-03-12 DIAGNOSIS — Z23 Encounter for immunization: Secondary | ICD-10-CM | POA: Diagnosis not present

## 2017-03-12 DIAGNOSIS — R2242 Localized swelling, mass and lump, left lower limb: Secondary | ICD-10-CM | POA: Diagnosis present

## 2017-03-12 DIAGNOSIS — L02416 Cutaneous abscess of left lower limb: Secondary | ICD-10-CM | POA: Insufficient documentation

## 2017-03-12 MED ORDER — TETANUS-DIPHTH-ACELL PERTUSSIS 5-2.5-18.5 LF-MCG/0.5 IM SUSP
0.5000 mL | Freq: Once | INTRAMUSCULAR | Status: AC
  Administered 2017-03-12: 0.5 mL via INTRAMUSCULAR
  Filled 2017-03-12: qty 0.5

## 2017-03-12 MED ORDER — DOXYCYCLINE HYCLATE 100 MG PO CAPS: 100.0000 mg | ORAL_CAPSULE | Freq: Two times a day (BID) | ORAL | 0 refills | Status: AC

## 2017-03-12 MED ORDER — LIDOCAINE HCL 2 % IJ SOLN
5.0000 mL | Freq: Once | INTRAMUSCULAR | Status: AC
  Administered 2017-03-12: 100 mg via INTRADERMAL
  Filled 2017-03-12: qty 20

## 2017-03-12 NOTE — ED Triage Notes (Signed)
Pt noticed bump on anterior left LE.  Unknown insect bite or other cause.  Swelling, redness worsened today, looks like blister - size of eraser head with no drainage.

## 2017-03-12 NOTE — ED Provider Notes (Signed)
[facility] EMERGENCY DEPARTMENT Provider Note   CSN: [id number] Arrival date & time: 03/12/17  1509     History   Chief Complaint Chief Complaint  Patient presents with  . Insect Bite     HPI Denise Banks is a 31 y.o. female presents to emergency department complaining of painful bump to the left lower leg.  Patient states she first noticed it 2 days ago.  She states it looked like a little pimple.  She states since then it has gotten larger with surrounding redness.  She states it is very tender.  No drainage.  History of abscess in the past.  Denies any fever or chills.  Denies any generalized malaise or any other systemic symptoms.  No treatment at home prior to coming in.  History reviewed. No pertinent past medical history.  Patient Active Problem List   Diagnosis Date Noted  . Ludwig's angina 06/28/2015    Past Surgical History:  Procedure Laterality Date  . ABSCESS DRAINAGE    . INCISION AND DRAINAGE ABSCESS N/A 06/28/2015   Procedure: INCISION AND DRAINAGE OF NECK/FLOOR OF MOUTH ABSCESS;  Surgeon: Osborn Coho, MD;  Location: [facility] OR;  Service: ENT;  Laterality: N/A;    OB History    No data available       Home Medications    Prior to Admission medications   Medication Sig Start Date End Date Taking? Authorizing Provider  amoxicillin (AMOXIL) 500 MG capsule Take 1 capsule (500 mg total) by mouth 3 (three) times daily. 11/11/15   Janne Napoleon, NP  ferrous sulfate 325 (65 FE) MG EC tablet Take 325 mg by mouth daily.      [provider]  HYDROcodone-acetaminophen (NORCO) 5-325 MG tablet Take 1 tablet by mouth every 6 (six) hours as needed. 11/11/15   Janne Napoleon, NP  vitamin B-12 (CYANOCOBALAMIN) 1000 MCG tablet Take 1,000 mcg by mouth daily.      [provider]    Family History Family History  Problem Relation Age of Onset  . Hypertension Sister   . Hypertension Maternal Grandmother   . Diabetes Mellitus II  Maternal Grandmother   . Kidney disease Cousin     Social History Social History  Substance Use Topics  . Smoking status: Current Every Day Smoker    Packs/day: 0.50    Types: Cigarettes  . Smokeless tobacco: Never Used  . Alcohol use No     Allergies   Other   Review of Systems Review of Systems  Constitutional: Negative for diaphoresis and fever.  Musculoskeletal: Positive for arthralgias.  Skin: Positive for color change and wound.  Neurological: Negative for weakness.  All other systems reviewed and are negative.    Physical Exam Updated Vital Signs BP 109/67 (BP Location: Right Arm)   Pulse 76   Temp (!) 97.5 F (36.4 C) (Oral)   Resp 20   LMP 03/11/2017   SpO2 99%   Physical Exam  Constitutional: She appears well-developed and well-nourished. No distress.  Eyes: Conjunctivae are normal.  Neck: Neck supple.  Neurological: She is alert.  Skin: Skin is warm and dry.  2 x 2 centimeter area of fluctuance to the anterior left mid shin.  There is surrounding erythema and induration. Warm to the touch.  No drainage.  Nursing note and vitals reviewed.    ED Treatments / Results  Labs (all labs ordered are listed, but only abnormal results are displayed) Labs Reviewed - No data to  display  EKG  EKG Interpretation None       Radiology No results found.  Procedures .Marland Kitchen.Incision and Drainage Date/Time: 03/12/2017 7:14 PM Performed by: Jaynie CrumbleKIRICHENKO, Elowyn Raupp Authorized by: Jaynie CrumbleKIRICHENKO, Wilferd Ritson   Consent:    Consent obtained:  Verbal   Consent given by:  Patient   Risks discussed:  Bleeding, incomplete drainage, pain and infection   Alternatives discussed:  No treatment Location:    Type:  Abscess   Location:  Lower extremity   Lower extremity location:  Leg   Leg location:  L lower leg Pre-procedure details:    Skin preparation:  Betadine Anesthesia (see MAR for exact dosages):    Anesthesia method:  Local infiltration   Local anesthetic:   Lidocaine 2% w/o epi Procedure type:    Complexity:  Simple Procedure details:    Needle aspiration: no     Incision types:  Single straight   Scalpel blade:  11   Wound management:  Irrigated with saline and probed and deloculated   Drainage:  Bloody and purulent   Drainage amount:  Moderate   Wound treatment:  Wound left open   Packing materials:  None Post-procedure details:    Patient tolerance of procedure:  Tolerated well, no immediate complications   (including critical care time)  Medications Ordered in ED Medications  lidocaine (XYLOCAINE) 2 % (with pres) injection 100 mg (not administered)     Initial Impression / Assessment and Plan / ED Course  I have reviewed the triage vital signs and the nursing notes.  Pertinent labs & imaging results that were available during my care of the patient were reviewed by me and considered in my medical decision making (see chart for details).     Patient to the emergency department with an abscess to the left lower leg.  Incised and drained with large amount of purulent and bloody drainage.  It did not pack.  Will discharge home with wound care, soaks, antibiotics.  Follow-up with family doctor.  Patient otherwise afebrile, nontoxic appearing.  Stable for discharge home.  Close return precautions discussed.  Vitals:   03/12/17 1555  BP: 109/67  Pulse: 76  Resp: 20  Temp: (!) 97.5 F (36.4 C)  TempSrc: Oral  SpO2: 99%     Final Clinical Impressions(s) / ED Diagnoses   Final diagnoses:  Abscess    New Prescriptions New Prescriptions   DOXYCYCLINE (VIBRAMYCIN) 100 MG CAPSULE    Take 1 capsule (100 mg total) by mouth 2 (two) times daily.     Jaynie CrumbleKirichenko, Dhriti Fales, PA-C 03/12/17 1918    Shaune PollackIsaacs, Cameron, MD 03/13/17 [phone]

## 2017-03-12 NOTE — Discharge Instructions (Signed)
Warm compresses or soaks to the left lower leg.  Take antibiotic as prescribed until all gone.  Ibuprofen or Tylenol for pain.  Follow-up with family doctor as needed for recheck.  Return if worsening

## 2018-03-01 ENCOUNTER — Other Ambulatory Visit: Payer: Self-pay

## 2018-03-01 ENCOUNTER — Emergency Department (HOSPITAL_COMMUNITY): Payer: Self-pay

## 2018-03-01 ENCOUNTER — Encounter (HOSPITAL_COMMUNITY): Payer: Self-pay

## 2018-03-01 ENCOUNTER — Emergency Department (HOSPITAL_COMMUNITY)
Admission: EM | Admit: 2018-03-01 | Discharge: 2018-03-01 | Disposition: A | Discharge status: Home / Self Care | Payer: Self-pay | Attending: Emergency Medicine | Admitting: Emergency Medicine

## 2018-03-01 DIAGNOSIS — R112 Nausea with vomiting, unspecified: Secondary | ICD-10-CM | POA: Insufficient documentation

## 2018-03-01 DIAGNOSIS — F17210 Nicotine dependence, cigarettes, uncomplicated: Secondary | ICD-10-CM | POA: Insufficient documentation

## 2018-03-01 LAB — COMPREHENSIVE METABOLIC PANEL
ALBUMIN: 3.9 g/dL (ref 3.5–5.0)
ALT: 18 U/L (ref 0–44)
AST: 29 U/L (ref 15–41)
Alkaline Phosphatase: 62 U/L (ref 38–126)
Anion gap: 8 (ref 5–15)
BILIRUBIN TOTAL: 0.6 mg/dL (ref 0.3–1.2)
BUN: 9 mg/dL (ref 6–20)
CHLORIDE: 103 mmol/L (ref 98–111)
CO2: 25 mmol/L (ref 22–32)
CREATININE: 1.07 mg/dL — AB (ref 0.44–1.00)
Calcium: 9.5 mg/dL (ref 8.9–10.3)
GFR calc Af Amer: >60 mL/min (ref >60)
GLUCOSE: 100 mg/dL — AB (ref 70–99)
POTASSIUM: 3.7 mmol/L (ref 3.5–5.1)
Sodium: 136 mmol/L (ref 135–145)
Total Protein: 7.3 g/dL (ref 6.5–8.1)

## 2018-03-01 LAB — URINALYSIS, ROUTINE W REFLEX MICROSCOPIC
Bilirubin Urine: NEGATIVE
GLUCOSE, UA: NEGATIVE mg/dL
HGB URINE DIPSTICK: NEGATIVE
KETONES UR: NEGATIVE mg/dL
Leukocytes, UA: NEGATIVE
NITRITE: NEGATIVE
PH: 6 (ref 5.0–8.0)
Protein, ur: NEGATIVE mg/dL
SPECIFIC GRAVITY, URINE: 1.028 (ref 1.005–1.030)

## 2018-03-01 LAB — CBC
HEMATOCRIT: 42.6 % (ref 36.0–46.0)
HEMOGLOBIN: 13.6 g/dL (ref 12.0–15.0)
MCH: 28.9 pg (ref 26.0–34.0)
MCHC: 31.9 g/dL (ref 30.0–36.0)
MCV: 90.4 fL (ref 80.0–100.0)
Platelets: 212 10*3/uL (ref 150–400)
RBC: 4.71 MIL/uL (ref 3.87–5.11)
RDW: 13.1 % (ref 11.5–15.5)
WBC: 8.9 10*3/uL (ref 4.0–10.5)
nRBC: 0 % (ref 0.0–0.2)

## 2018-03-01 LAB — I-STAT BETA HCG BLOOD, ED (MC, WL, AP ONLY): I-stat hCG, quantitative: <5 m[IU]/mL (ref <5)

## 2018-03-01 LAB — LIPASE, BLOOD: LIPASE: 26 U/L (ref 11–51)

## 2018-03-01 MED ORDER — ONDANSETRON 4 MG PO TBDP: 4.0000 mg | ORAL_TABLET | Freq: Three times a day (TID) | ORAL | 0 refills | Status: AC | PRN

## 2018-03-01 NOTE — ED Provider Notes (Signed)
[facility] EMERGENCY DEPARTMENT Provider Note   CSN: [id number] Arrival date & time: 03/01/18  1818     History   Chief Complaint Chief Complaint  Patient presents with  . Emesis    HPI Denise Banks is a 32 y.o. female.  Patient presents to the emergency department with a chief complaint of nausea and vomiting.  She reports having had prandial vomiting for the past 2 days.  She denies any fevers, chills, or focal abdominal pain.  She does report some mild upper abdominal cramps.  She has not taken anything for her symptoms.  She denies any known sick contacts.  She denies any dysuria, hematuria, new or unusual vaginal discharge or bleeding.  She denies any prior abdominal surgeries.  She states that she feels fine except that she vomits after she eats.  She states that she needs a note for work.  The history is provided by the patient. No language interpreter was used.    History reviewed. No pertinent past medical history.  Patient Active Problem List   Diagnosis Date Noted  . Ludwig's angina 06/28/2015    Past Surgical History:  Procedure Laterality Date  . ABSCESS DRAINAGE    . INCISION AND DRAINAGE ABSCESS N/A 06/28/2015   Procedure: INCISION AND DRAINAGE OF NECK/FLOOR OF MOUTH ABSCESS;  Surgeon: Osborn Coho, MD;  Location: [facility] OR;  Service: ENT;  Laterality: N/A;     OB History   None      Home Medications    Prior to Admission medications   Medication Sig Start Date End Date Taking? Authorizing Provider  amoxicillin (AMOXIL) 500 MG capsule Take 1 capsule (500 mg total) by mouth 3 (three) times daily. 11/11/15   Janne Napoleon, NP  doxycycline (VIBRAMYCIN) 100 MG capsule Take 1 capsule (100 mg total) by mouth 2 (two) times daily. 03/12/17   Kirichenko, Lemont Fillers, PA-C  ferrous sulfate 325 (65 FE) MG EC tablet Take 325 mg by mouth daily.      [provider]  HYDROcodone-acetaminophen (NORCO) 5-325 MG tablet Take 1 tablet by mouth  every 6 (six) hours as needed. 11/11/15   Janne Napoleon, NP  vitamin B-12 (CYANOCOBALAMIN) 1000 MCG tablet Take 1,000 mcg by mouth daily.      [provider]    Family History Family History  Problem Relation Age of Onset  . Hypertension Sister   . Hypertension Maternal Grandmother   . Diabetes Mellitus II Maternal Grandmother   . Kidney disease Cousin     Social History Social History   Tobacco Use  . Smoking status: Current Every Day Smoker    Packs/day: 0.50    Types: Cigarettes  . Smokeless tobacco: Never Used  Substance Use Topics  . Alcohol use: No  . Drug use: No     Allergies   Other   Review of Systems Review of Systems  All other systems reviewed and are negative.    Physical Exam Updated Vital Signs BP 103/78 (BP Location: Right Arm)   Pulse 68   Temp 98.6 F (37 C) (Oral)   Resp 16   Ht 5\' 3"  (1.6 m)   Wt 69.4 kg   LMP 02/20/2018   SpO2 98%   BMI 27.10 kg/m   Physical Exam  Constitutional: She is oriented to person, place, and time. She appears well-developed and well-nourished.  HENT:  Head: Normocephalic and atraumatic.  Eyes: Pupils are equal, round, and reactive to light. Conjunctivae and EOM are  normal.  Neck: Normal range of motion. Neck supple.  Cardiovascular: Normal rate and regular rhythm. Exam reveals no gallop and no friction rub.  No murmur heard. Pulmonary/Chest: Effort normal and breath sounds normal. No respiratory distress. She has no wheezes. She has no rales. She exhibits no tenderness.  Abdominal: Soft. Bowel sounds are normal. She exhibits no distension and no mass. There is no tenderness. There is no rebound and no guarding.  No focal abdominal tenderness, no RLQ tenderness or pain at McBurney's point, no RUQ tenderness or Murphy's sign, no left-sided abdominal tenderness, no fluid wave, or signs of peritonitis   Musculoskeletal: Normal range of motion. She exhibits no edema or tenderness.  Neurological: She is  alert and oriented to person, place, and time.  Skin: Skin is warm and dry.  Psychiatric: She has a normal mood and affect. Her behavior is normal. Judgment and thought content normal.  Nursing note and vitals reviewed.    ED Treatments / Results  Labs (all labs ordered are listed, but only abnormal results are displayed) Labs Reviewed  COMPREHENSIVE METABOLIC PANEL - Abnormal; Notable for the following components:      Result Value   Glucose, Bld 100 (*)    Creatinine, Ser 1.07 (*)    All other components within normal limits  URINALYSIS, ROUTINE W REFLEX MICROSCOPIC - Abnormal; Notable for the following components:   APPearance HAZY (*)    All other components within normal limits  LIPASE, BLOOD  CBC  I-STAT BETA HCG BLOOD, ED ([facility], [facility], [facility] ONLY)    EKG None  Radiology Dg Chest 2 View  Result Date: 03/01/2018 CLINICAL DATA:  Vomiting EXAM: CHEST - 2 VIEW COMPARISON:  None. FINDINGS: Heart and mediastinal contours are within normal limits. No focal opacities or effusions. No acute bony abnormality. Upper abdominal bowel gas pattern normal. Moderate stool in the visualized transverse colon. IMPRESSION: No active cardiopulmonary disease. Electronically Signed   By: Charlett Nose M.D.   On: 03/01/2018 21:24    Procedures Procedures (including critical care time)  Medications Ordered in ED Medications - No data to display   Initial Impression / Assessment and Plan / ED Course  I have reviewed the triage vital signs and the nursing notes.  Pertinent labs & imaging results that were available during my care of the patient were reviewed by me and considered in my medical decision making (see chart for details).     Patient with vomiting x2 days.  Denies any fevers.  Her abdomen is soft and nontender.  There is no right upper quadrant tenderness or Murphy sign, no right lower quadrant tenderness or pain at McBurney's point.  She is very well-appearing.  She is in no acute  distress.  Laboratory work-up is reassuring.  She is not pregnant.  No evidence of UTI.  Electrolytes and CBC are normal.  Will finish fluid bolus which was ordered in triage, discharged home with Zofran.  Given strict return precautions.  Unclear etiology of the patient's symptoms, but at this time will proceed with watchful waiting.  Final Clinical Impressions(s) / ED Diagnoses   Final diagnoses:  Nausea and vomiting, intractability of vomiting not specified, unspecified vomiting type    ED Discharge Orders         Ordered    ondansetron (ZOFRAN ODT) 4 MG disintegrating tablet  Every 8 hours PRN     03/01/18 2317           Roxy Horseman, PA-C 03/01/18 2318  Marily Memos, MD 03/04/18 [phone]

## 2018-03-01 NOTE — ED Provider Notes (Signed)
Patient placed in Quick Look pathway, seen and evaluated   Chief Complaint: emesis  HPI: Denise Banks is a 32 y.o. female who presents to the ED with vomiting. Patient reports that every time she eats she begins feeling like a knot in her stomach, it goes away then she finishes eating and then vomits.   ROS: GI: vomiting  Physical Exam:  BP 108/72   Pulse 63   Temp 98.7 F (37.1 C) (Oral)   Resp 18   Ht 5\' 3"  (1.6 m)   Wt 69.4 kg   LMP 02/20/2018   SpO2 99%   BMI 27.10 kg/m    Gen: No distress  Neuro: Awake and Alert  Skin: Warm and dry  Abdomen: nontender   Initiation of care has begun. The patient has been counseled on the process, plan, and necessity for staying for the completion/evaluation, and the remainder of the medical screening examination    Janne Napoleon, NP 03/01/18 1951    Marily Memos, MD 03/01/18 2230

## 2018-03-01 NOTE — ED Notes (Signed)
1 L NS given.

## 2018-03-01 NOTE — ED Triage Notes (Signed)
Pt states that since Monday she has been vomiting, denies fevers, denies abd pain or diarrhea.

## 2021-11-11 ENCOUNTER — Other Ambulatory Visit (HOSPITAL_COMMUNITY): Payer: Self-pay | Admitting: Family Medicine

## 2021-11-11 DIAGNOSIS — Z431 Encounter for attention to gastrostomy: Secondary | ICD-10-CM

## 2023-12-12 ENCOUNTER — Encounter (HOSPITAL_COMMUNITY): Payer: Self-pay

## 2023-12-12 ENCOUNTER — Other Ambulatory Visit: Payer: Self-pay

## 2023-12-12 ENCOUNTER — Emergency Department (HOSPITAL_COMMUNITY)
Admission: EM | Admit: 2023-12-12 | Discharge: 2023-12-13 | Discharge status: Against Medical Advice | Payer: Self-pay | Attending: Emergency Medicine | Admitting: Emergency Medicine

## 2023-12-12 DIAGNOSIS — H9209 Otalgia, unspecified ear: Secondary | ICD-10-CM | POA: Insufficient documentation

## 2023-12-12 DIAGNOSIS — K011 Impacted teeth: Secondary | ICD-10-CM | POA: Insufficient documentation

## 2023-12-12 DIAGNOSIS — Z5321 Procedure and treatment not carried out due to patient leaving prior to being seen by health care provider: Secondary | ICD-10-CM | POA: Insufficient documentation

## 2023-12-12 NOTE — ED Triage Notes (Signed)
 Patient c/o right sided dental pain r/t impacted wisdom tooth causing pain with swallowing, ear pain and head pain. Patient unable to get into dentist office until the end of the month. Patient endorses fever yesterday.

## 2023-12-13 NOTE — ED Notes (Signed)
 Pt called x2 for a room with no answer.
